# Patient Record
Sex: Female | Born: 1955 | Hispanic: No | Marital: Married | Smoking: Never smoker
Health system: Southern US, Community
[De-identification: ages and names within clinical notes are randomized; demographics above are authoritative.]

## PROBLEM LIST (undated history)

## (undated) DIAGNOSIS — L259 Unspecified contact dermatitis, unspecified cause: Secondary | ICD-10-CM

## (undated) DIAGNOSIS — N951 Menopausal and female climacteric states: Secondary | ICD-10-CM

## (undated) DIAGNOSIS — K219 Gastro-esophageal reflux disease without esophagitis: Secondary | ICD-10-CM

## (undated) DIAGNOSIS — I10 Essential (primary) hypertension: Secondary | ICD-10-CM

## (undated) DIAGNOSIS — R42 Dizziness and giddiness: Secondary | ICD-10-CM

## (undated) HISTORY — DX: Unspecified contact dermatitis, unspecified cause: L25.9

## (undated) HISTORY — PX: NO PAST SURGERIES: SHX2092

## (undated) HISTORY — DX: Menopausal and female climacteric states: N95.1

## (undated) HISTORY — DX: Gastro-esophageal reflux disease without esophagitis: K21.9

## (undated) HISTORY — DX: Essential (primary) hypertension: I10

## (undated) HISTORY — DX: Dizziness and giddiness: R42

---

## 2003-01-02 ENCOUNTER — Encounter: Payer: Self-pay | Admitting: Specialist

## 2003-01-02 ENCOUNTER — Encounter: Admission: RE | Admit: 2003-01-02 | Discharge: 2003-01-02 | Payer: Self-pay | Admitting: Specialist

## 2009-11-08 ENCOUNTER — Ambulatory Visit: Payer: Self-pay | Admitting: Internal Medicine

## 2009-11-08 DIAGNOSIS — R079 Chest pain, unspecified: Secondary | ICD-10-CM | POA: Insufficient documentation

## 2009-11-08 DIAGNOSIS — R9431 Abnormal electrocardiogram [ECG] [EKG]: Secondary | ICD-10-CM | POA: Insufficient documentation

## 2009-11-08 DIAGNOSIS — I10 Essential (primary) hypertension: Secondary | ICD-10-CM | POA: Insufficient documentation

## 2009-11-22 ENCOUNTER — Ambulatory Visit: Payer: Self-pay | Admitting: Internal Medicine

## 2009-11-22 DIAGNOSIS — L259 Unspecified contact dermatitis, unspecified cause: Secondary | ICD-10-CM | POA: Insufficient documentation

## 2010-10-01 NOTE — Assessment & Plan Note (Signed)
Summary: 2 WEEK FOLLOW UP-APPT OKAYED BY JONES NURSE TO SWITCH PER PT-LB   Vital Signs:  Patient profile:   55 year old female Height:      60 inches (152.40 cm) Weight:      105.8 pounds (48.09 kg) O2 Sat:      97 % on Room air Temp:     98.3 degrees F (36.83 degrees C) oral Pulse rate:   53 / minute BP sitting:   142 / 82  (left arm) Cuff size:   regular  Vitals Entered By: Orlan Leavens (November 22, 2009 10:21 AM)  O2 Flow:  Room air CC: Transferring from Dr. Yetta Barre, 2 week f/u on BP Is Patient Diabetic? No Pain Assessment Patient in pain? no        Primary Care Provider:  Newt Lukes MD  CC:  Transferring from Dr. Yetta Barre and 2 week f/u on BP.  History of Present Illness: new to me here for 2 week followup pt speaks no english but here with dtr who translates for her  seen by TJ for chest discomfort related to trauma - CXR reviewed and EKG also started medication (samples) for HTN - chest feels better on vimomo - uses only as needed for disconfort - almost no pain now   Current Medications (verified): 1)  Bystolic 5 Mg Tabs (Nebivolol Hcl) .... One By Mouth Once Daily For High Blood Pressure  Allergies (verified): 1)  ! Prednisone  Past History:  Past Medical History: Hypertension excema  MD rooster: cards - little?  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         +occ dizzy spells while standing, started 1 mo ago - no syncope; not positional  Physical Exam  General:  alert, well-developed, well-nourished, and cooperative to examination.  , dtr at side Eyes:  vision grossly intact; pupils equal, round and reactive to light.  conjunctiva and lids normal.    Lungs:  normal  respiratory effort, no intercostal retractions or use of accessory muscles; normal breath sounds bilaterally - no crackles and no wheezes.    Heart:  normal rate, regular rhythm, no murmur, and no rub. BLE without edema. Abdomen:  soft, non-tender, normal bowel sounds, no distention; no masses and no appreciable hepatomegaly or splenomegaly.   Skin:  +patchy excema across lower back, upper buttocks and front torso lower abd   Impression & Recommendations:  Problem # 1:  HYPERTENSION (ICD-401.9)  change to more affordable option -e-rx done labs recently done for life insurance physical, will bring copy next OV - reck 4 weeks Her updated medication list for this problem includes:    Amlodipine Besylate 5 Mg Tabs (Amlodipine besylate) .Marland Kitchen... 1 by mouth once daily  BP today: 142/82 Prior BP: 118/80 (11/08/2009)  Orders: Prescription Created Electronically [phone])  Problem # 2:  ECZEMA (ICD-692.9)  Her updated medication list for this problem includes:    Triamcinolone Acetonide 0.1 % Oint (Triamcinolone acetonide) .Marland Kitchen... Apply to rash and itch three times a day as needed  Discussed avoidance of triggers and symptomatic treatment.   Orders: Prescription Created Electronically [phone])  Problem # 3:  CHEST PAIN (ICD-786.50) Assessment: Improved s/p trauma - NSAIDs as needed   Complete Medication List: 1)  Amlodipine Besylate 5  Mg Tabs (Amlodipine besylate) .Marland Kitchen.. 1 by mouth once daily 2)  Triamcinolone Acetonide 0.1 % Oint (Triamcinolone acetonide) .... Apply to rash and itch three times a day as needed  Patient Instructions: 1)  it was good to see you today. 2)  chest xray and EKG results reviewed today - will not order any other labs or tests at this time 3)  stop bystolic and start amlodipine for high blood pressure - 4)  also use triamcinalone ointment for rash and itch 5)  your prescriptions have been electronically submitted to your pharmacy. Please take as directed. Contact  our office if you believe you're having problems with the medication(s).  6)  Please schedule a follow-up appointment in 4 weeks, sooner if problems.  Prescriptions: TRIAMCINOLONE ACETONIDE 0.1 % OINT (TRIAMCINOLONE ACETONIDE) apply to rash and itch three times a day as needed  #15g x 1   Entered and Authorized by:   Newt Lukes MD   Signed by:   Newt Lukes MD on 11/22/2009   Method used:   Electronically to        [facility]* (retail)       [address] [place], [place]  [address]       Ph: [phone]       Fax: [phone]   RxID:   [id number] AMLODIPINE BESYLATE 5 MG TABS (AMLODIPINE BESYLATE) 1 by mouth once daily  #30 x 5   Entered and Authorized by:   Newt Lukes MD   Signed by:   Newt Lukes MD on 11/22/2009   Method used:   Electronically to        [facility]* (retail)       [address] [place], [place]  [address]       Ph: [phone]       Fax: [phone]   RxID:   [id number]
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
## 2010-10-22 ENCOUNTER — Telehealth: Payer: Self-pay | Admitting: Internal Medicine

## 2010-10-29 NOTE — Progress Notes (Deleted)
Summary: Call Report  Phone Note Other Incoming   Caller: [facility] Summary of Call: [facility] Triage Call Report Triage Record Num: [id number] Operator: Cynthia Bowlin Patient Name: Danielle Terry Call Date & Time: 10/21/2010 8:36:14PM Patient Phone: [phone] PCP: Patient Gender: Female PCP Fax : Patient DOB: 09/14/1955 Practice Name: [facility] Reason for Call: [name] calling that mom is having nausea and has vomited x 10 + since this AM. No frank chest pain. No diarrhea. Afebrile. Sx started this AM. B/P is 136/78. Last voided at 1700 and has been able to keep some fluid down. Triaged N&V and all ? neg. Home care and call back inst given. Called in per S/O Phenergan 25 mg 1 PO Q 4 hrs prn # 6 and no refills. Called into [facility] on [address] @ [phone] and spoke with Arron. THE PATIENT REFUSED 911 Protocol(s) Used: Nausea or Vomiting Recommended Outcome per Protocol: Provide Home/Self Care Reason for Outcome: New onset of 3-4 episodes vomiting or diarrhea following mild abdominal cramping Care Advice:  ~ Call provider if symptoms do not improve after 24 hours of home care. Call provider immediately if develop severe pain, black, tarry stools, bloody stools, blood-streaked or coffee ground-looking vomitus, or abdomen swollen.  ~ Go to the ED if you have developed signs and symptoms of dehydration such as very dry mouth and tongue; increased pulse rate at rest; no urine output for 8 hours or more; increasing weakness or drowsiness, or lightheadedness when trying to sit upright or standing.  ~ Vomiting and Diarrhea Care: - Do not eat solid foods until vomiting subsides. - Begin taking fluids by sucking on ice chips or popsicles or taking sips of cool, clear fluids (soda, fruit juices that are low acid, sports drinks or nonprescription oral rehydration solution). - Gradually drink larger amounts of these fluids so that you are drinking six to eight 8  oz. (1.2 to 1.6 liters) of fluids a day. - Keep activity to a minimum. - Once vomiting and diarrhea subside, eat smaller, more frequent meals of eas  Follow-up for Phone Call        Pt had NP appt scheduled 10/2009 - cancelled Follow-up by: Dahlia Bonyun-DeBourgh, CMA,  October 22, 2010 8:44 AM    

## 2010-11-05 ENCOUNTER — Other Ambulatory Visit: Payer: Self-pay | Admitting: Internal Medicine

## 2010-11-05 ENCOUNTER — Other Ambulatory Visit: Payer: Self-pay

## 2010-11-05 ENCOUNTER — Encounter: Payer: Self-pay | Admitting: Internal Medicine

## 2010-11-05 ENCOUNTER — Ambulatory Visit (INDEPENDENT_AMBULATORY_CARE_PROVIDER_SITE_OTHER): Payer: Self-pay | Admitting: Internal Medicine

## 2010-11-05 DIAGNOSIS — I10 Essential (primary) hypertension: Secondary | ICD-10-CM

## 2010-11-05 DIAGNOSIS — R1011 Right upper quadrant pain: Secondary | ICD-10-CM

## 2010-11-05 DIAGNOSIS — R110 Nausea: Secondary | ICD-10-CM

## 2010-11-05 DIAGNOSIS — N951 Menopausal and female climacteric states: Secondary | ICD-10-CM | POA: Insufficient documentation

## 2010-11-05 DIAGNOSIS — N390 Urinary tract infection, site not specified: Secondary | ICD-10-CM

## 2010-11-05 LAB — BASIC METABOLIC PANEL
BUN: 16 mg/dL (ref 6–23)
Calcium: 9.5 mg/dL (ref 8.4–10.5)
GFR: 146.77 mL/min (ref >60.00)
Glucose, Bld: 83 mg/dL (ref 70–99)

## 2010-11-05 LAB — CBC WITH DIFFERENTIAL/PLATELET
Basophils Absolute: 0 10*3/uL (ref 0.0–0.1)
Eosinophils Absolute: 0.2 10*3/uL (ref 0.0–0.7)
HCT: 39.8 % (ref 36.0–46.0)
Lymphocytes Relative: 26.7 % (ref 12.0–46.0)
Lymphs Abs: 1.7 10*3/uL (ref 0.7–4.0)
MCHC: 34.6 g/dL (ref 30.0–36.0)
Monocytes Relative: 5.7 % (ref 3.0–12.0)
Platelets: 292 10*3/uL (ref 150.0–400.0)
RDW: 12.9 % (ref 11.5–14.6)

## 2010-11-05 LAB — CONVERTED CEMR LAB
Glucose, Urine, Semiquant: NEGATIVE
Ketones, urine, test strip: NEGATIVE
Urobilinogen, UA: 0.2
pH: 5

## 2010-11-05 LAB — HEPATIC FUNCTION PANEL
AST: 17 U/L (ref 0–37)
Total Bilirubin: 0.4 mg/dL (ref 0.3–1.2)

## 2010-11-05 LAB — TSH: TSH: 0.85 u[IU]/mL (ref 0.35–5.50)

## 2010-11-07 NOTE — Progress Notes (Signed)
Summary: Call Report  Phone Note Other Incoming   Caller: [facility] Summary of Call: [facility] Triage Call Report Triage Record Num: [id number] Operator: Caswell Corwin Patient Name: Danielle Terry Call Date & Time: 10/21/2010 8:36:14PM Patient Phone: [phone] PCP: Patient Gender: Female PCP Fax : Patient DOB: 1956-06-18 Practice Name: [facility] Reason for Call: [name] calling that mom is having nausea and has vomited x 10 + since this AM. No frank chest pain. No diarrhea. Afebrile. Sx started this AM. B/P is 136/78. Last voided at 1700 and has been able to keep some fluid down. Triaged N&V and all ? neg. Home care and call back inst given. Called in per S/O Phenergan 25 mg 1 PO Q 4 hrs prn # 6 and no refills. Called into [facility] on [address] @ [phone] and spoke with Arron. THE PATIENT REFUSED 911 Protocol(s) Used: Nausea or Vomiting Recommended Outcome per Protocol: Provide Home/Self Care Reason for Outcome: New onset of 3-4 episodes vomiting or diarrhea following mild abdominal cramping Care Advice:  ~ Call provider if symptoms do not improve after 24 hours of home care. Call provider immediately if develop severe pain, black, tarry stools, bloody stools, blood-streaked or coffee ground-looking vomitus, or abdomen swollen.  ~ Go to the ED if you have developed signs and symptoms of dehydration such as very dry mouth and tongue; increased pulse rate at rest; no urine output for 8 hours or more; increasing weakness or drowsiness, or lightheadedness when trying to sit upright or standing.  ~ Vomiting and Diarrhea Care: - Do not eat solid foods until vomiting subsides. - Begin taking fluids by sucking on ice chips or popsicles or taking sips of cool, clear fluids (soda, fruit juices that are low acid, sports drinks or nonprescription oral rehydration solution). - Gradually drink larger amounts of these fluids so that you are drinking six to eight 8  oz. (1.2 to 1.6 liters) of fluids a day. - Keep activity to a minimum. - Once vomiting and diarrhea subside, eat smaller, more frequent meals of eas  Follow-up for Phone Call        Pt had NP appt scheduled 10/2009 - cancelled Follow-up by: Margaret Pyle, CMA,  October 22, 2010 8:44 AM

## 2010-11-12 NOTE — Assessment & Plan Note (Signed)
Summary: UTI /NWS   Vital Signs:  Patient profile:   55 year old female O2 Sat:      97 % on Room air Temp:     98.4 degrees F (36.89 degrees C) oral Pulse rate:   62 / minute BP sitting:   110 / 80  (left arm) Cuff size:   regular  Vitals Entered By: Orlan Leavens RMA (November 05, 2010 9:35 AM)  O2 Flow:  Room air CC: ? UTI Is Patient Diabetic? No Pain Assessment Patient in pain? no        Primary Care Provider:  Newt Lukes MD  CC:  ? UTI.  History of Present Illness: here with uti symptoms  onset 5 days ago small vol freq voids no fevevr, no flank pain  also c/o positional vertigo symptoms x 3 weeks head movement causes nausea -  no HA, ear pain or tinnitus  also RUQ pain at times after meals no vomitting or BM change  HTN - reports compliance with ongoing medical treatment and no changes in medication dose or frequency. denies adverse side effects related to current therapy.   Current Medications (verified): 1)  Amlodipine Besylate 5 Mg Tabs (Amlodipine Besylate) .Marland Kitchen.. 1 By Mouth Once Daily 2)  Triamcinolone Acetonide 0.1 % Oint (Triamcinolone Acetonide) .... Apply To Rash and Itch Three Times A Day As Needed  Allergies (verified): 1)  ! 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    Physical Exam  General:  alert, well-developed, well-nourished, and cooperative to examination.  dtr at side Lungs:  normal respiratory effort, no intercostal retractions or use of accessory muscles; normal breath sounds bilaterally - no crackles and no wheezes.    Heart:  normal rate, regular rhythm, no murmur, and no rub. BLE without edema. Neurologic:  No cranial nerve  deficits noted. Station and gait are normal. Plantar reflexes are down-going bilaterally. DTRs are symmetrical throughout. Sensory, motor and coordinative functions appear intact.   Impression & Recommendations:  Problem # 1:  UTI (ICD-599.0)  Her updated medication list for this problem includes:    Ciprofloxacin Hcl 250 Mg Tabs (Ciprofloxacin hcl) .Marland Kitchen... 1 by mouth two times a day x 3 days  Orders: UA Dipstick w/o Micro (manual) (40981) Prescription Created Electronically [phone])  Encouraged to push clear liquids, get enough rest, and take acetaminophen as needed. To be seen in 10 days if no improvement, sooner if worse.  Problem # 2:  NAUSEA (ICD-787.02) suspect related to BPPV but check labs r/o GI or other abn - Her updated medication list for this problem includes:    Meclizine Hcl 12.5 Mg Tabs (Meclizine hcl) .Marland Kitchen... 1 by mouth three times a day as needed for dizzy symptoms - may cause sedation  Orders: TLB-BMP (Basic Metabolic Panel-BMET) (80048-METABOL) TLB-CBC Platelet - w/Differential (85025-CBCD) TLB-Hepatic/Liver Function Pnl (80076-HEPATIC) TLB-TSH (Thyroid Stimulating Hormone) (82956-OZH) Prescription Created Electronically [id number])  Problem # 3:  RUQ PAIN (ICD-789.01)  see nausea above Orders: TLB-BMP (Basic Metabolic Panel-BMET) (80048-METABOL) TLB-CBC Platelet - w/Differential (85025-CBCD) TLB-Hepatic/Liver Function Pnl (80076-HEPATIC) TLB-TSH (Thyroid Stimulating Hormone) (84443-TSH)  Discussed symptom control with the patient.   Problem # 4:  HYPERTENSION (ICD-401.9)  reduce dose amlodipine to avoid overtx exac dizzy and nausea  symptoms  Her updated medication list for this problem includes:    Amlodipine Besylate 2.5 Mg Tabs (Amlodipine besylate) .Marland Kitchen... 1 by mouth once daily for blood pressure  BP today: 110/80 Prior BP: 142/82 (11/22/2009)  Orders: Prescription Created Electronically [id number])  Problem # 5:  MENOPAUSAL SYNDROME (ICD-627.2) start SNRI  for same - f/u 3 mo to review, sooner if prob  Complete Medication List: 1)  Amlodipine Besylate 2.5 Mg Tabs (Amlodipine besylate) .Marland Kitchen.. 1 by mouth once daily for blood pressure 2)  Triamcinolone Acetonide 0.1 % Oint (Triamcinolone acetonide) .... Apply to rash and itch three times a day as needed 3)  Meclizine Hcl 12.5 Mg Tabs (Meclizine hcl) .Marland Kitchen.. 1 by mouth three times a day as needed for dizzy symptoms - may cause sedation 4)  Venlafaxine Hcl 37.5 Mg Xr24h-tab (Venlafaxine hcl) .Marland Kitchen.. 1 by mouth once daily for menopause symptoms 5)  Ciprofloxacin Hcl 250 Mg Tabs (Ciprofloxacin hcl) .Marland Kitchen.. 1 by mouth two times a day x 3 days  Patient Instructions: 1)  it was good to see you today. 2)  test(s) ordered today - your results will be called to you after review in 48-72 hours from the time of test completion 3)  medications as listed below 4)  Please schedule a follow-up appointment in 3 months to recheck blood pressure and review menopause symptoms/medications, call sooner if problems.  Prescriptions: CIPROFLOXACIN HCL 250 MG TABS (CIPROFLOXACIN HCL) 1 by mouth two times a day x 3 days  #6 x 0   Entered and Authorized by:   Newt Lukes MD   Signed by:   Newt Lukes MD on 11/05/2010   Method used:   Electronically to        [facility]* (retail)       [address] [place], [place]  [address]       Ph: [phone]       Fax: [phone]   RxID:   [id number] VENLAFAXINE HCL 37.5 MG XR24H-TAB (VENLAFAXINE HCL) 1 by mouth once daily for menopause symptoms  #30 x 3   Entered and Authorized by:   Newt Lukes MD   Signed by:   Newt Lukes MD on 11/05/2010   Method used:   Electronically to        [facility]* (retail)       [address] [place], [place]  [address]       Ph: [phone]       Fax: [phone]   RxID:   [id number] MECLIZINE HCL 12.5 MG TABS (MECLIZINE HCL) 1 by mouth three  times a day as needed for dizzy symptoms - may cause sedation  #30 x 1   Entered and Authorized by:   Newt Lukes MD   Signed by:   Newt Lukes MD on 11/05/2010   Method used:   Electronically to        [facility]* (retail)       [address] [place], [place]  [address]       Ph: [phone]       Fax: [phone]   RxID:   [id number] AMLODIPINE BESYLATE 2.5 MG TABS (AMLODIPINE BESYLATE) 1 by mouth once daily for blood pressure  #  30 x 6   Entered and Authorized by:   Newt Lukes MD   Signed by:   Newt Lukes MD on 11/05/2010   Method used:   Electronically to        [facility]* (retail)       [address] [place], [place]  [address]       Ph: [phone]       Fax: [phone]   RxID:   [id number]    Orders Added: 1)  UA Dipstick w/o Micro (manual) [81002] 2)  TLB-BMP (Basic Metabolic Panel-BMET) [80048-METABOL] 3)  TLB-CBC Platelet - w/Differential [85025-CBCD] 4)  TLB-Hepatic/Liver Function Pnl [80076-HEPATIC] 5)  TLB-TSH (Thyroid Stimulating Hormone) [84443-TSH] 6)  Est. Patient Level IV [01027] 7)  Prescription Created Electronically [id number]    Laboratory Results   Urine Tests    Routine Urinalysis   Color: lt. yellow Appearance: Clear Glucose: negative   (Normal Range: Negative) Bilirubin: negative   (Normal Range: Negative) Ketone: negative   (Normal Range: Negative) Spec. Gravity: <1.005   (Normal Range: 1.003-1.035) Blood: trace-intact   (Normal Range: Negative) pH: 5.0   (Normal Range: 5.0-8.0) Protein: negative   (Normal Range: Negative) Urobilinogen: 0.2   (Normal Range: 0-1) Nitrite: negative   (Normal Range: Negative) Leukocyte Esterace: small   (Normal Range: Negative)

## 2011-07-10 LAB — BASIC METABOLIC PANEL: Glucose: 72 mg/dL

## 2011-07-10 LAB — HEPATIC FUNCTION PANEL
ALT: 12 U/L (ref 7–35)
AST: 17 U/L (ref 13–35)
Alkaline Phosphatase: 116 U/L (ref 25–125)

## 2011-08-14 ENCOUNTER — Encounter: Payer: Self-pay | Admitting: Internal Medicine

## 2011-08-14 ENCOUNTER — Other Ambulatory Visit: Payer: Self-pay | Admitting: Internal Medicine

## 2011-08-14 ENCOUNTER — Other Ambulatory Visit (INDEPENDENT_AMBULATORY_CARE_PROVIDER_SITE_OTHER): Payer: BC Managed Care – PPO

## 2011-08-14 ENCOUNTER — Ambulatory Visit (INDEPENDENT_AMBULATORY_CARE_PROVIDER_SITE_OTHER): Payer: BC Managed Care – PPO | Admitting: Internal Medicine

## 2011-08-14 VITALS — BP 120/82 | HR 60 | Temp 98.6°F | Wt 100.4 lb

## 2011-08-14 DIAGNOSIS — Z0000 Encounter for general adult medical examination without abnormal findings: Secondary | ICD-10-CM

## 2011-08-14 DIAGNOSIS — Z1211 Encounter for screening for malignant neoplasm of colon: Secondary | ICD-10-CM

## 2011-08-14 DIAGNOSIS — Z124 Encounter for screening for malignant neoplasm of cervix: Secondary | ICD-10-CM

## 2011-08-14 DIAGNOSIS — R110 Nausea: Secondary | ICD-10-CM

## 2011-08-14 DIAGNOSIS — Z1239 Encounter for other screening for malignant neoplasm of breast: Secondary | ICD-10-CM

## 2011-08-14 DIAGNOSIS — K219 Gastro-esophageal reflux disease without esophagitis: Secondary | ICD-10-CM | POA: Insufficient documentation

## 2011-08-14 DIAGNOSIS — I10 Essential (primary) hypertension: Secondary | ICD-10-CM | POA: Insufficient documentation

## 2011-08-14 LAB — CBC WITH DIFFERENTIAL/PLATELET
Basophils Absolute: 0.1 10*3/uL (ref 0.0–0.1)
Eosinophils Absolute: 0.2 10*3/uL (ref 0.0–0.7)
HCT: 41.6 % (ref 36.0–46.0)
Lymphs Abs: 1.9 10*3/uL (ref 0.7–4.0)
MCHC: 34.2 g/dL (ref 30.0–36.0)
MCV: 92.3 fl (ref 78.0–100.0)
Monocytes Absolute: 0.3 10*3/uL (ref 0.1–1.0)
Monocytes Relative: 5 % (ref 3.0–12.0)
Platelets: 310 10*3/uL (ref 150.0–400.0)
RDW: 12.4 % (ref 11.5–14.6)

## 2011-08-14 LAB — URINALYSIS, ROUTINE W REFLEX MICROSCOPIC
Bilirubin Urine: NEGATIVE
Hgb urine dipstick: NEGATIVE
Nitrite: POSITIVE
Urobilinogen, UA: 0.2 (ref 0.0–1.0)

## 2011-08-14 LAB — BASIC METABOLIC PANEL
BUN: 14 mg/dL (ref 6–23)
CO2: 29 mEq/L (ref 19–32)
GFR: 112.57 mL/min (ref >60.00)
Glucose, Bld: 95 mg/dL (ref 70–99)
Potassium: 4.4 mEq/L (ref 3.5–5.1)

## 2011-08-14 LAB — LIPID PANEL
Cholesterol: 206 mg/dL — ABNORMAL HIGH (ref 0–200)
Total CHOL/HDL Ratio: 4
Triglycerides: 104 mg/dL (ref 0.0–149.0)

## 2011-08-14 LAB — HEPATIC FUNCTION PANEL
ALT: 11 U/L (ref 0–35)
Bilirubin, Direct: 0.1 mg/dL (ref 0.0–0.3)
Total Bilirubin: 0.5 mg/dL (ref 0.3–1.2)

## 2011-08-14 LAB — TSH: TSH: 0.6 u[IU]/mL (ref 0.35–5.50)

## 2011-08-14 LAB — LDL CHOLESTEROL, DIRECT: Direct LDL: 133.6 mg/dL

## 2011-08-14 MED ORDER — MECLIZINE HCL 12.5 MG PO TABS: 12.5000 mg | ORAL_TABLET | Freq: Three times a day (TID) | ORAL | Status: AC | PRN

## 2011-08-14 MED ORDER — RANITIDINE HCL 150 MG PO TABS: 150.0000 mg | ORAL_TABLET | Freq: Two times a day (BID) | ORAL | Status: DC

## 2011-08-14 NOTE — Patient Instructions (Signed)
It was good to see you today. Test(s) ordered today. Your results will be called to you after review (48-72hours after test completion). If any changes need to be made, you will be notified at that time. we'll make referral to if her Pap smear, and GI for colonoscopy and for mammogram. Our office will contact you regarding appointment(s) once made. Start Zantac twice daily for stomach symptoms and phlegm, also meclizine as needed for dizziness Your prescription(s) have been submitted to your pharmacy. Please take as directed and contact our office if you believe you are having problem(s) with the medication(s). Other medications reviewed and no changes today Please schedule followup in 6 months for blood pressure check and review, call sooner if problems.

## 2011-08-14 NOTE — Assessment & Plan Note (Signed)
Morning nausea and mild tenderness improves with first meal Await labs as for CPX today - examined history otherwise benign No relief with prior intermittent PPI use but prescribe today twice daily Zantac at this time If symptoms unimproved, will consider imaging or GI evaluation as needed

## 2011-08-14 NOTE — Assessment & Plan Note (Signed)
BP Readings from Last 3 Encounters:  08/14/11 120/82  11/05/10 110/80  11/22/09 142/82   Tolerating amlodipine better than atenolol with fewer spells of fatigue and vertigo (suspected overtreatment on beta-blocker) Neuro exam benign. Recommend use of meclizine as needed in addition to ongoing therapy

## 2011-08-14 NOTE — Progress Notes (Signed)
Subjective:    Patient ID: Danielle Terry, female    DOB: 11-11-1955, 55 y.o.   MRN: [id number]  HPI patient is here today for annual physical. Patient feels well overall.  Would also like to review chronic medical issues:  Hypertension. Changed from atenolol to amlodipine March 2012 because of dizziness and fatigue symptoms - complains of mild transient edema at the end of the day but no other adverse side effects reported. Tolerating medication well, vertigo episodes seem less frequent than while on atenolol tx  Complains of mild epigastric discomfort and nausea. Daily symptom each morning, resolves with breakfast meal. No radiation of pain. No vomiting or bowel changes. No unintended weight changes. Previously prescribed omeprazole but not taking same. Associated with backwash sensation and phlegm clearing in throat but no cough  Past Medical History  Diagnosis Date  . Hypertension   . MENOPAUSAL SYNDROME   . Vertigo   . GERD (gastroesophageal reflux disease)    History reviewed. No pertinent family history. History  Substance Use Topics  . Smoking status: Never Smoker   . Smokeless tobacco: Not on file  . Alcohol Use: No    Review of Systems Constitutional: Negative for fever or weight change.  Respiratory: Negative for cough and shortness of breath.   Cardiovascular: Negative for chest pain or palpitations.  Gastrointestinal: Negative bowel changes. see hpi above Musculoskeletal: Negative for gait problem or joint swelling.  Skin: Negative for rash.  Neurological: Negative for dizziness or headache.  No other specific complaints in a complete review of systems (except as listed in HPI above).     Objective:   Physical Exam BP 120/82  Pulse 60  Temp(Src) 98.6 F (37 C) (Oral)  Wt 100 lb 6.4 oz (45.541 kg)  SpO2 98% Wt Readings from Last 3 Encounters:  08/14/11 100 lb 6.4 oz (45.541 kg)  11/22/09 105 lb 12.8 oz (47.991 kg)  11/08/09 105 lb (47.628 kg)    Constitutional: She is [place] and does not speak [place]. She is thin, but appears well-developed and well-nourished. No distress.  daughter at side, serves as Nurse, learning disability HENT: Head: Normocephalic and atraumatic. Ears: B TMs ok, no erythema or effusion; Nose: Nose normal.  Mouth/Throat: Oropharynx is clear and moist. No oropharyngeal exudate.  Eyes: wears glasses. Conjunctivae and EOM are normal. Pupils are equal, round, and reactive to light. No scleral icterus.  Neck: Normal range of motion. Neck supple. No JVD present. No thyromegaly present.  Cardiovascular: Normal rate, regular rhythm and normal heart sounds.  No murmur heard. No BLE edema. Pulmonary/Chest: Effort normal and breath sounds normal. No respiratory distress. She has no wheezes.  Abdominal: Soft. Bowel sounds are normal. She exhibits no distension. There is minimal epigastric and LUQ tenderness to palpation. No rebound, guarding and no masses GU: Defer to GYN  Musculoskeletal: Normal range of motion, no joint effusions. No gross deformities Neurological: She is alert and oriented to person, place, and time. No cranial nerve deficit. Coordination normal.  Skin: Skin is warm and dry. No rash noted. No erythema.  Psychiatric: She has a normal mood and affect. Her behavior is normal. Judgment and thought content normal.   Lab Results  Component Value Date   WBC 6.4 11/05/2010   HGB 13.8 11/05/2010   HCT 39.8 11/05/2010   PLT 292.0 11/05/2010   GLUCOSE 83 11/05/2010   CHOL 228* 07/10/2011   TRIG 99 07/10/2011   HDL 71* 07/10/2011   ALT 12 07/10/2011   AST 17 07/10/2011  NA 140 11/05/2010   K 4.6 11/05/2010   CL 104 11/05/2010   CREATININE 0.6 07/10/2011   BUN 12 07/10/2011   CO2 30 11/05/2010   TSH 0.85 11/05/2010   HGBA1C 5.7 07/10/2011       Assessment & Plan:  CPX - v70.0 - Patient has been counseled on age-appropriate routine health concerns for screening and prevention. These are reviewed and up-to-date. Immunizations are up-to-date  or declined. Labs ordered and will be reviewed. Referrals for mammography, Pap smear and colonoscopy arranged today  Also see problem list. Medications and labs reviewed today.

## 2011-08-18 ENCOUNTER — Other Ambulatory Visit: Payer: Self-pay | Admitting: Internal Medicine

## 2011-08-18 MED ORDER — CIPROFLOXACIN HCL 250 MG PO TABS: 250.0000 mg | ORAL_TABLET | Freq: Two times a day (BID) | ORAL | Status: AC

## 2011-10-03 ENCOUNTER — Other Ambulatory Visit (HOSPITAL_COMMUNITY): Payer: Self-pay | Admitting: Obstetrics & Gynecology

## 2011-10-03 DIAGNOSIS — Z780 Asymptomatic menopausal state: Secondary | ICD-10-CM

## 2011-10-03 DIAGNOSIS — Z1231 Encounter for screening mammogram for malignant neoplasm of breast: Secondary | ICD-10-CM

## 2011-11-05 ENCOUNTER — Ambulatory Visit (HOSPITAL_COMMUNITY)
Admission: RE | Admit: 2011-11-05 | Discharge: 2011-11-05 | Disposition: A | Discharge status: Home / Self Care | Payer: BC Managed Care – PPO | Source: Ambulatory Visit | Attending: Obstetrics & Gynecology | Admitting: Obstetrics & Gynecology

## 2011-11-05 DIAGNOSIS — Z13820 Encounter for screening for osteoporosis: Secondary | ICD-10-CM | POA: Insufficient documentation

## 2011-11-05 DIAGNOSIS — Z1231 Encounter for screening mammogram for malignant neoplasm of breast: Secondary | ICD-10-CM

## 2011-11-05 DIAGNOSIS — Z780 Asymptomatic menopausal state: Secondary | ICD-10-CM

## 2012-01-02 ENCOUNTER — Encounter: Payer: Self-pay | Admitting: Internal Medicine

## 2012-01-02 DIAGNOSIS — L259 Unspecified contact dermatitis, unspecified cause: Secondary | ICD-10-CM

## 2012-02-10 ENCOUNTER — Ambulatory Visit: Payer: BC Managed Care – PPO | Admitting: Internal Medicine

## 2012-02-10 DIAGNOSIS — Z0289 Encounter for other administrative examinations: Secondary | ICD-10-CM

## 2012-06-07 ENCOUNTER — Ambulatory Visit: Payer: BC Managed Care – PPO | Admitting: Internal Medicine

## 2012-08-13 ENCOUNTER — Ambulatory Visit: Payer: Self-pay | Admitting: Internal Medicine

## 2012-08-22 IMAGING — MG MM DIGITAL SCREENING BILAT
5 series · 5 of 5 positions shown · non-contrast
Comparison: none

DG SCREEN MAMMOGRAM BILATERAL
Bilateral CC and MLO view(s) were taken.

DIGITAL SCREENING MAMMOGRAM WITH CAD:
The breast tissue is heterogeneously dense.  There is no dominant mass, architectural distortion or
calcification to suggest malignancy.
Images were processed with CAD.

[R CC (1 of 2)]
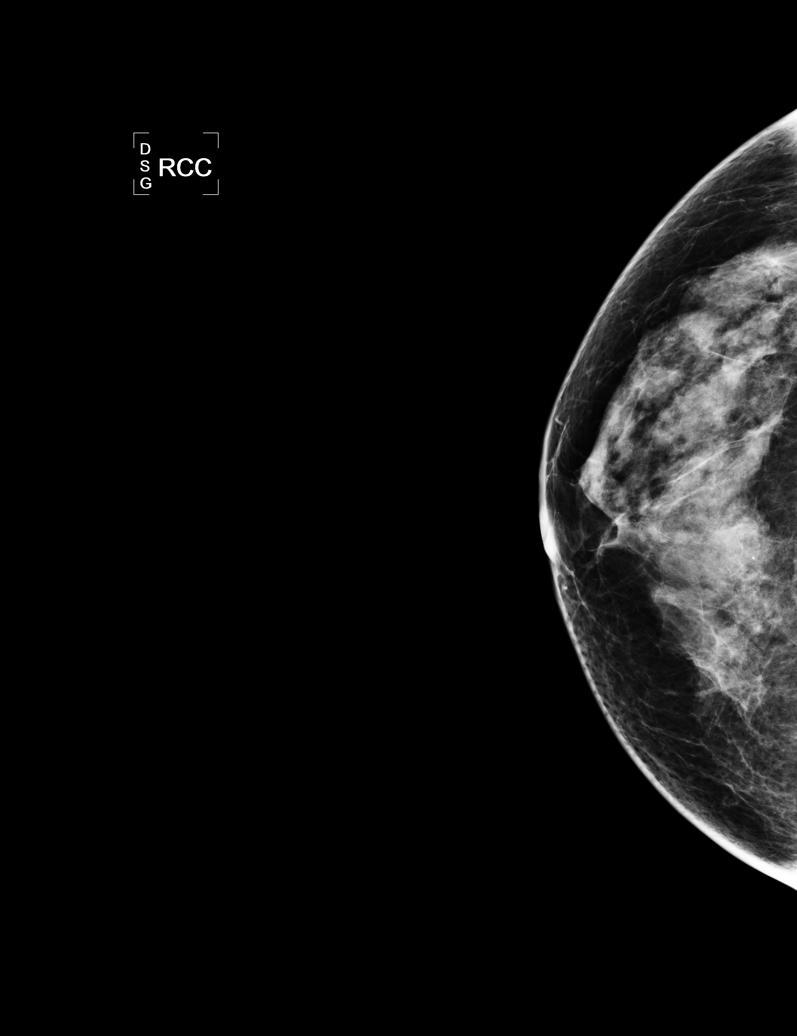

[R MLO]
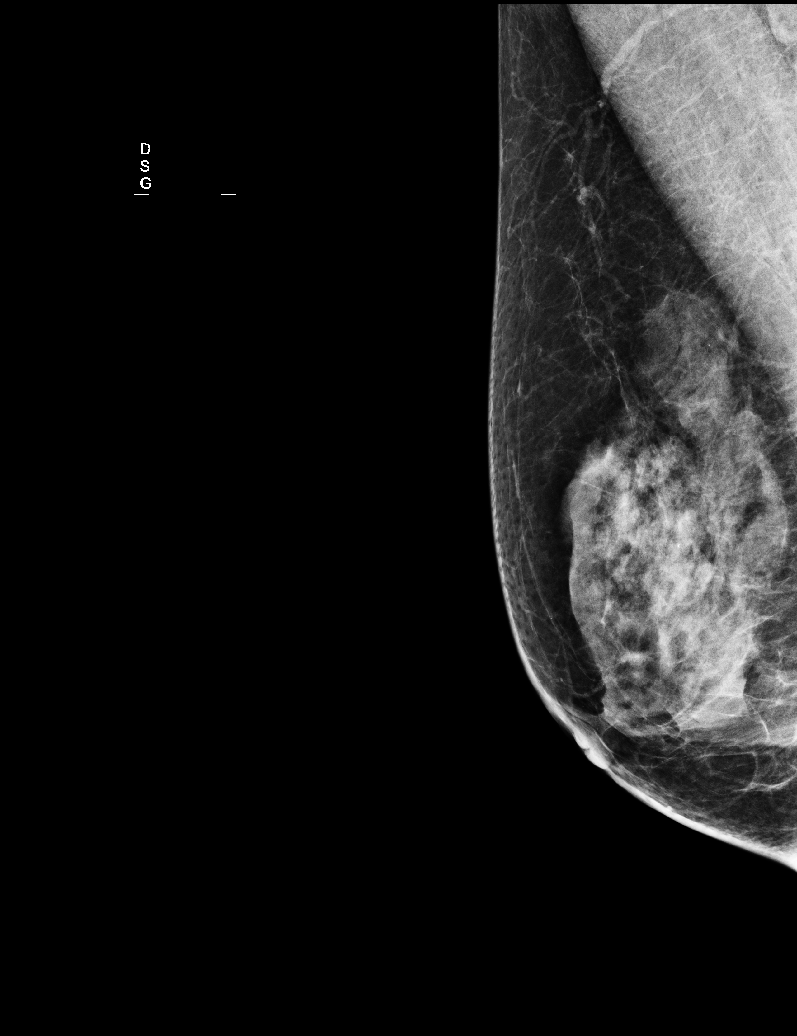

[L CC]
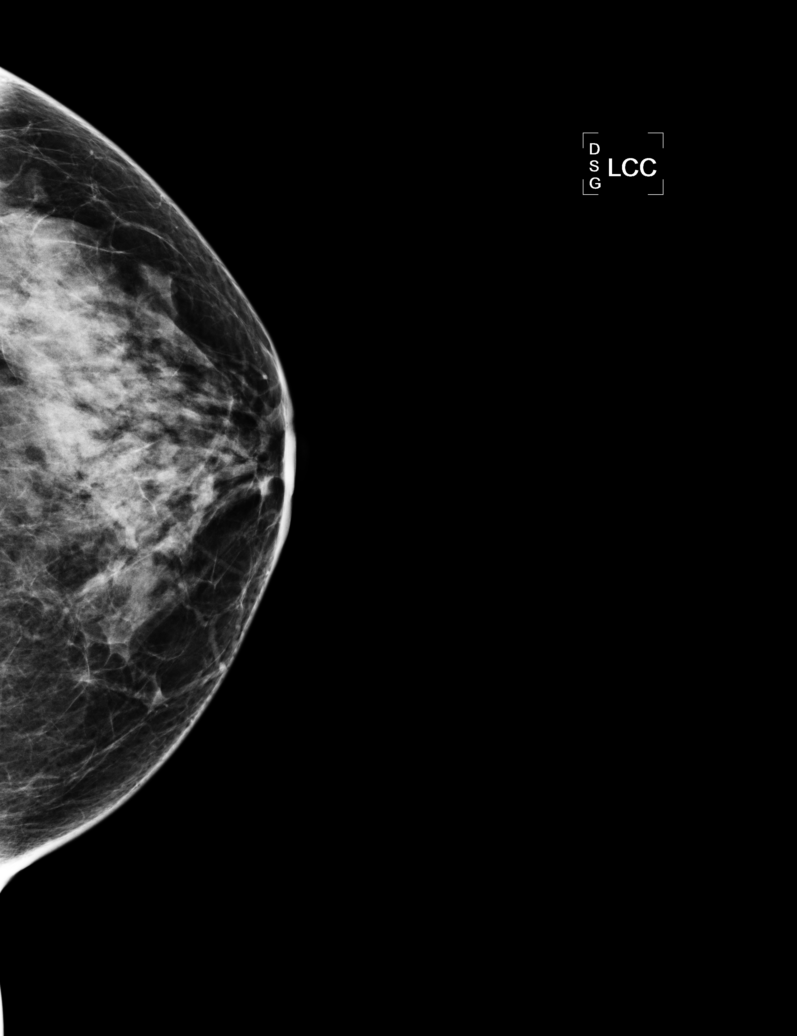

[L MLO]
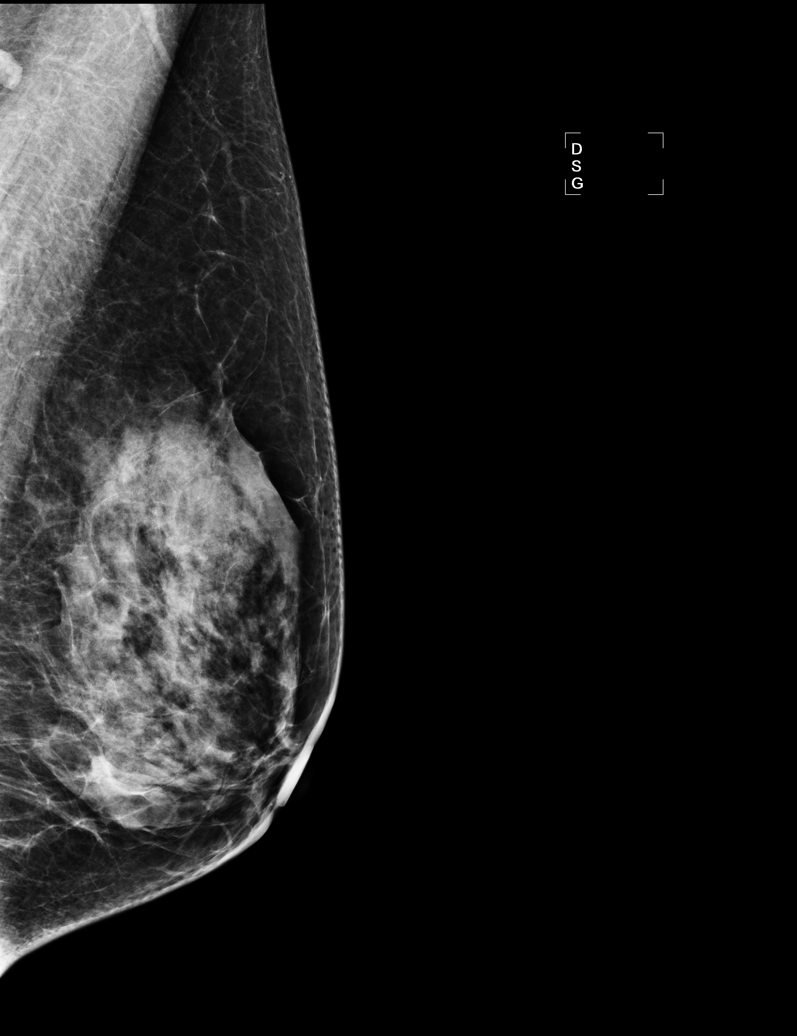

[R CC (2 of 2)]
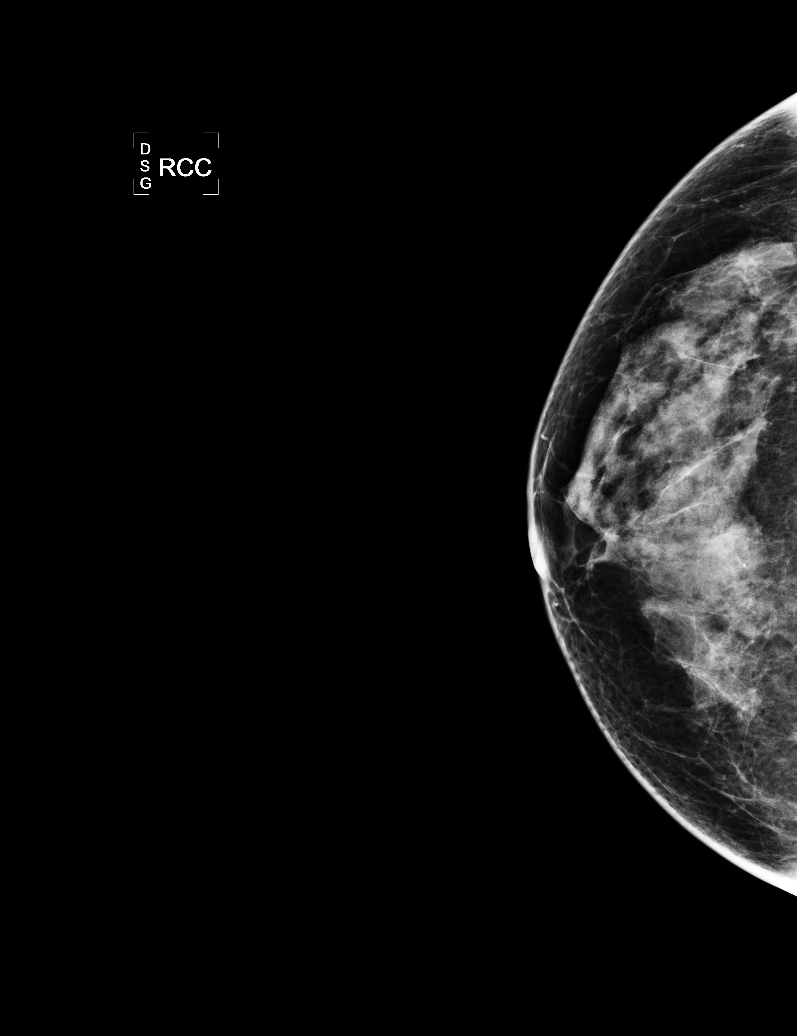

[5 of 5 positions shown; findings below may reference images not displayed]

IMPRESSION: No mammographic evidence of malignancy.  Suggest yearly screening mammography.

A result letter of this screening mammogram will be mailed directly to the patient.

ASSESSMENT: Negative - BI-RADS 1

Screening mammogram in 1 year.
,

## 2012-09-03 ENCOUNTER — Ambulatory Visit: Payer: BC Managed Care – PPO | Admitting: Internal Medicine

## 2012-09-07 ENCOUNTER — Ambulatory Visit: Payer: BC Managed Care – PPO | Admitting: Internal Medicine

## 2012-09-08 ENCOUNTER — Ambulatory Visit (INDEPENDENT_AMBULATORY_CARE_PROVIDER_SITE_OTHER): Payer: BC Managed Care – PPO | Admitting: Internal Medicine

## 2012-09-08 ENCOUNTER — Ambulatory Visit (INDEPENDENT_AMBULATORY_CARE_PROVIDER_SITE_OTHER)
Admission: RE | Admit: 2012-09-08 | Discharge: 2012-09-08 | Disposition: A | Discharge status: Home / Self Care | Payer: BC Managed Care – PPO | Source: Ambulatory Visit | Attending: Internal Medicine | Admitting: Internal Medicine

## 2012-09-08 ENCOUNTER — Encounter: Payer: Self-pay | Admitting: Internal Medicine

## 2012-09-08 VITALS — BP 120/82 | HR 76 | Temp 99.2°F | Resp 18

## 2012-09-08 DIAGNOSIS — M5416 Radiculopathy, lumbar region: Secondary | ICD-10-CM

## 2012-09-08 DIAGNOSIS — IMO0002 Reserved for concepts with insufficient information to code with codable children: Secondary | ICD-10-CM

## 2012-09-08 DIAGNOSIS — M549 Dorsalgia, unspecified: Secondary | ICD-10-CM

## 2012-09-08 MED ORDER — METHOCARBAMOL 500 MG PO TABS: 500.0000 mg | ORAL_TABLET | Freq: Three times a day (TID) | ORAL | Status: DC | PRN

## 2012-09-08 MED ORDER — DEXAMETHASONE 1.5 MG PO KIT: PACK | ORAL | Status: DC

## 2012-09-08 NOTE — Progress Notes (Signed)
Subjective:    Patient ID: Danielle Terry, female    DOB: Dec 16, 1955, 57 y.o.   MRN: [id number]  Back Pain This is a new problem. The current episode started more than 1 month ago. The problem occurs intermittently. The problem is unchanged. The pain is present in the lumbar spine and gluteal. The quality of the pain is described as cramping and shooting. The pain radiates to the left thigh. The pain is moderate. The pain is worse during the day. The symptoms are aggravated by sitting, standing and coughing. Stiffness is present all day. Associated symptoms include leg pain. Pertinent negatives include no abdominal pain, bladder incontinence, bowel incontinence, chest pain, dysuria, fever, headaches, numbness, paresis, paresthesias, pelvic pain, perianal numbness, tingling, weakness or weight loss. Risk factors: fall 05/2012. She has tried NSAIDs and analgesics for the symptoms. The treatment provided no relief.   Past Medical History  Diagnosis Date  . Hypertension   . MENOPAUSAL SYNDROME   . Vertigo   . GERD (gastroesophageal reflux disease)   . ECZEMA     Review of Systems  Constitutional: Negative for fever and weight loss.  Cardiovascular: Negative for chest pain.  Gastrointestinal: Negative for abdominal pain and bowel incontinence.  Genitourinary: Negative for bladder incontinence, dysuria and pelvic pain.  Musculoskeletal: Positive for back pain.  Neurological: Negative for tingling, weakness, numbness, headaches and paresthesias.       Objective:   Physical Exam BP 120/82  Pulse 76  Temp 99.2 F (37.3 C)  Resp 18 Wt Readings from Last 3 Encounters:  08/14/11 100 lb 6.4 oz (45.541 kg)  11/22/09 105 lb 12.8 oz (47.991 kg)  11/08/09 105 lb (47.628 kg)   Constitutional: She appears well-developed and well-nourished. No distress. nontoxic Cardiovascular: Normal rate, regular rhythm and normal heart sounds.  No murmur heard. No BLE edema. Pulmonary/Chest: Effort normal and  breath sounds normal. No respiratory distress. She has no wheezes.  Musculoskeletal: Back: full range of motion of thoracic and lumbar spine. tender to palpation over upper lumbar and L paraspinal region. Positive ipsilateral and contralater straight leg raise with pain in LLE. DTR's are symmetrically intact. Sensation intact in all dermatomes of the lower extremities. Full strength to manual muscle testing. patient is able to heel toe walk without difficulty and ambulates with antalgic gait. Neurological: She is alert and oriented to person, place, and time. No cranial nerve deficit. Coordination normal.  Skin: Skin is warm and dry. No rash noted. No erythema.  Psychiatric: She has a normal mood and affect. Her behavior is normal. Judgment and thought content normal.   Lab Results  Component Value Date   WBC 6.8 08/14/2011   HGB 14.2 08/14/2011   HCT 41.6 08/14/2011   PLT 310.0 08/14/2011   GLUCOSE 95 08/14/2011   CHOL 206* 08/14/2011   TRIG 104.0 08/14/2011   HDL 58.20 08/14/2011   LDLDIRECT 133.6 08/14/2011   ALT 11 08/14/2011   AST 17 08/14/2011   NA 140 08/14/2011   K 4.4 08/14/2011   CL 105 08/14/2011   CREATININE 0.6 08/14/2011   BUN 14 08/14/2011   CO2 29 08/14/2011   TSH 0.60 08/14/2011   HGBA1C 5.7 07/10/2011       Assessment & Plan:  LLE pain and sciatica since fall 05/2012 Lumbar back pain since fall 05/2012  Check plan film rule out compression fx MRI L spine rule out HNP or other nerve impingement tx with steroids - will use dexa in place of pred due to ?  pred allergy (caused fever) Also muscle relaxer

## 2012-09-08 NOTE — Patient Instructions (Signed)
It was good to see you today. We have reviewed your prior records including labs and tests today Dexamethasone taper x 6days for inflammation pain and robaxin as needed for muscle spasm pain - Your prescription(s) have been submitted to your pharmacy. Please take as directed and contact our office if you believe you are having problem(s) with the medication(s). Test(s) ordered today - xray and MRI refer. Your results will be released to MyChart (or called to you) after review, usually within 72hours after test completion. If any changes need to be made, you will be notified at that same time. Back Exercises Back exercises help treat and prevent back injuries. The goal is to increase your strength in your belly (abdominal) and back muscles. These exercises can also help with flexibility. Start these exercises when told by your doctor. HOME CARE Back exercises include: Pelvic Tilt.  Lie on your back with your knees bent. Tilt your pelvis until the lower part of your back is against the floor. Hold this position 5 to 10 sec. Repeat this exercise 5 to 10 times.  Knee to Chest.  Pull 1 knee up against your chest and hold for 20 to 30 seconds. Repeat this with the other knee. This may be done with the other leg straight or bent, whichever feels better. Then, pull both knees up against your chest.  Sit-Ups or Curl-Ups.  Bend your knees 90 degrees. Start with tilting your pelvis, and do a partial, slow sit-up. Only lift your upper half 30 to 45 degrees off the floor. Take at least 2 to 3 seonds for each sit-up. Do not do sit-ups with your knees out straight. If partial sit-ups are difficult, simply do the above but with only tightening your belly (abdominal) muscles and holding it as told.  Hip-Lift.  Lie on your back with your knees flexed 90 degrees. Push down with your feet and shoulders as you raise your hips 2 inches off the floor. Hold for 10 seconds, repeat 5 to 10 times.  Back Arches.  Lie on  your stomach. Prop yourself up on bent elbows. Slowly press on your hands, causing an arch in your low back. Repeat 3 to 5 times.  Shoulder-Lifts.  Lie face down with arms beside your body. Keep hips and belly pressed to floor as you slowly lift your head and shoulders off the floor.  Do not overdo your exercises. Be careful in the beginning. Exercises may cause you some mild back discomfort. If the pain lasts for more than 15 minutes, stop the exercises until you see your doctor. Improvement with exercise for back problems is slow.   Document Released: 09/20/2010 Document Revised: 11/10/2011 Document Reviewed: 06/19/2011 [facility] Patient Information 2013 [place], [place].

## 2012-09-17 ENCOUNTER — Telehealth: Payer: Self-pay | Admitting: Internal Medicine

## 2012-09-17 NOTE — Telephone Encounter (Signed)
Patient is complaining that the medication she was given to help with her back is not helping and she wants to see if there is something else to try, Call [facility] @ [phone]

## 2012-09-17 NOTE — Telephone Encounter (Signed)
Please read note below

## 2012-09-18 MED ORDER — TRAMADOL HCL 50 MG PO TABS: 50.0000 mg | ORAL_TABLET | Freq: Four times a day (QID) | ORAL | Status: DC | PRN

## 2012-09-18 NOTE — Telephone Encounter (Signed)
1) tramadol 50mg  q6h prn - erx done 2) please coordinate with [facility] re: scheduling MRI lumbar spine as it appears [facility] has had difficulty contacting pt (per notes in imaging order referral) - we need to get MRI done!

## 2012-09-20 MED ORDER — HYDROCODONE-ACETAMINOPHEN 5-325 MG PO TABS: 1.0000 | ORAL_TABLET | Freq: Four times a day (QID) | ORAL | Status: DC | PRN

## 2012-09-20 NOTE — Telephone Encounter (Signed)
Can try Norco - please call or fax in rx

## 2012-09-20 NOTE — Telephone Encounter (Signed)
Rx for Norco called into [facility], pt's daughter informed.

## 2012-09-20 NOTE — Telephone Encounter (Signed)
Per daughter, pt has trouble with Tramadol, it causes nausea. Also gave daughter's # to [facility] to set up appointment for MRI.

## 2012-09-22 ENCOUNTER — Ambulatory Visit
Admission: RE | Admit: 2012-09-22 | Discharge: 2012-09-22 | Disposition: A | Discharge status: Home / Self Care | Payer: BC Managed Care – PPO | Source: Ambulatory Visit | Attending: Internal Medicine | Admitting: Internal Medicine

## 2012-09-22 ENCOUNTER — Telehealth: Payer: Self-pay | Admitting: Internal Medicine

## 2012-09-22 DIAGNOSIS — M5416 Radiculopathy, lumbar region: Secondary | ICD-10-CM | POA: Insufficient documentation

## 2012-09-22 DIAGNOSIS — S32030A Wedge compression fracture of third lumbar vertebra, initial encounter for closed fracture: Secondary | ICD-10-CM | POA: Insufficient documentation

## 2012-09-22 DIAGNOSIS — M549 Dorsalgia, unspecified: Secondary | ICD-10-CM

## 2012-09-22 NOTE — Telephone Encounter (Signed)
2 ND DAUGHTER OF PATIENT, RACHAEL, CALLED TO ASK WHY SOMEONE CALL HER MOM HOUSE. STATES HER SISTER ,jIN , IS AT SCHOOL. INFORMATION FROM DR. LESCHBER GIVEN TO RACHAEL AND UNDERSTANDS THE NEED TO GO TO SPECIALIST AND THAT [facility] WILL CALL WITH DATE AND TIME OF APPT. RACHAEL # [phone].

## 2012-09-22 NOTE — Telephone Encounter (Signed)
I have tried all 4 phone numbers (3 listed for pt and 1 listed for dtr Shon Hough) - no answer at any, [facility] with Jin's number -but  please continue to try to relay this info:   Please call patient/family: MRI results confirm her L3 compression fracture as well as nerve impingement on right sided L4.  These 2 abnormalities on MRI are the cause of patient's pain symptoms.  to treat her pain, I will refer to spine specialist who can address both of these issues. [facility] will call regarding this appointment  Until then, continue pain medications as ongoing. Okay to refill if needed

## 2012-10-16 ENCOUNTER — Other Ambulatory Visit: Payer: Self-pay

## 2013-02-24 ENCOUNTER — Encounter: Payer: Self-pay | Admitting: Internal Medicine

## 2013-02-24 ENCOUNTER — Ambulatory Visit (INDEPENDENT_AMBULATORY_CARE_PROVIDER_SITE_OTHER): Payer: BC Managed Care – PPO | Admitting: Internal Medicine

## 2013-02-24 VITALS — BP 130/82 | HR 71 | Temp 98.0°F | Wt 97.0 lb

## 2013-02-24 DIAGNOSIS — I10 Essential (primary) hypertension: Secondary | ICD-10-CM

## 2013-02-24 DIAGNOSIS — L259 Unspecified contact dermatitis, unspecified cause: Secondary | ICD-10-CM

## 2013-02-24 MED ORDER — TRIAMCINOLONE ACETONIDE 0.1 % EX LOTN: TOPICAL_LOTION | Freq: Two times a day (BID) | CUTANEOUS | Status: DC

## 2013-02-24 MED ORDER — PREDNISONE (PAK) 10 MG PO TABS: 10.0000 mg | ORAL_TABLET | ORAL | Status: DC

## 2013-02-24 MED ORDER — AMLODIPINE BESYLATE 2.5 MG PO TABS: 2.5000 mg | ORAL_TABLET | Freq: Every day | ORAL | Status: DC

## 2013-02-24 NOTE — Assessment & Plan Note (Signed)
BP Readings from Last 3 Encounters:  02/24/13 130/82  09/08/12 120/82  08/14/11 120/82   Tolerating amlodipine better than atenolol with fewer spells of fatigue and vertigo (suspected overtreatment on beta-blocker) Neuro exam benign. Recommend use of meclizine as needed in addition to ongoing therapy

## 2013-02-24 NOTE — Patient Instructions (Signed)
It was good to see you today. We have reviewed your prior records including labs and tests today Use prednisone tablet taper over the next 6 days to help with allergic reaction Use triamcinolone cream for itching as needed refill blood pressure medication as requested Followup in 6-12 months for blood pressure recheck, call sooner if he

## 2013-02-24 NOTE — Progress Notes (Signed)
Subjective:    Patient ID: Danielle Terry, female    DOB: 1956/04/06, 57 y.o.   MRN: [id number]  Rash This is a recurrent problem. The current episode started in the past 7 days. The problem has been gradually worsening since onset. The affected locations include the groin, genitalia, right buttock, left buttock, left upper leg, right upper leg, left lower leg and right lower leg. The rash is characterized by redness and itchiness. She was exposed to nothing. Pertinent negatives include no anorexia, congestion, cough, diarrhea, facial edema, fatigue, fever, joint pain, nail changes, rhinorrhea, shortness of breath, sore throat or vomiting. Past treatments include antihistamine and cold compress. The treatment provided mild relief. Her past medical history is significant for allergies and eczema.   Also here for follow up - reviewed chronic medical issues  Hypertension. Changed from atenolol to amlodipine March 2012 because of dizziness and fatigue symptoms - complains of mild transient edema at the end of the day but no other adverse side effects reported. Tolerating medication well, vertigo episodes are less frequent than while on atenolol tx   Past Medical History  Diagnosis Date  . Hypertension   . MENOPAUSAL SYNDROME   . Vertigo   . GERD (gastroesophageal reflux disease)   . ECZEMA    Review of Systems  Constitutional: Negative for fever and fatigue.  HENT: Negative for congestion, sore throat and rhinorrhea.   Respiratory: Negative for cough and shortness of breath.   Gastrointestinal: Negative for vomiting, diarrhea and anorexia.  Musculoskeletal: Negative for joint pain.  Skin: Positive for rash. Negative for nail changes.      Objective:   Physical Exam  BP 130/82  Pulse 71  Temp(Src) 98 F (36.7 C) (Oral)  Wt 97 lb (43.999 kg)  BMI 18.94 kg/m2  SpO2 96% Wt Readings from Last 3 Encounters:  02/24/13 97 lb (43.999 kg)  08/14/11 100 lb 6.4 oz (45.541 kg)  11/22/09 105 lb  12.8 oz (47.991 kg)   Constitutional: She is [place] and does not speak [place]. She is thin, but appears well-developed and well-nourished. No distress.  daughter at side, serves as Nurse, learning disability Neck: Normal range of motion. Neck supple. No JVD present. No thyromegaly present.  Cardiovascular: Normal rate, regular rhythm and normal heart sounds.  No murmur heard. No BLE edema. Pulmonary/Chest: Effort normal and breath sounds normal. No respiratory distress. She has no wheezes.  Skin: eczema changes - L>R buttock, posterior knees B and B groin; all with evidence for excoriation - remaining skin is warm and dry. No rash noted above waist on back, anterior chest, breast, head/neck or arms/hands. Psychiatric: She has a normal mood and affect. Her behavior is normal. Judgment and thought content normal.   Lab Results  Component Value Date   WBC 6.8 08/14/2011   HGB 14.2 08/14/2011   HCT 41.6 08/14/2011   PLT 310.0 08/14/2011   GLUCOSE 95 08/14/2011   CHOL 206* 08/14/2011   TRIG 104.0 08/14/2011   HDL 58.20 08/14/2011   LDLDIRECT 133.6 08/14/2011   ALT 11 08/14/2011   AST 17 08/14/2011   NA 140 08/14/2011   K 4.4 08/14/2011   CL 105 08/14/2011   CREATININE 0.6 08/14/2011   BUN 14 08/14/2011   CO2 29 08/14/2011   TSH 0.60 08/14/2011   HGBA1C 5.7 07/10/2011       Assessment & Plan:   Eczema - hx same - tx with pred (verified no true allergy to same) and topical steroid as needed - erx  done - ok to continue OTC benadryl prn  also see problem list. Medications and labs reviewed today.

## 2013-03-01 LAB — HM PAP SMEAR

## 2013-07-07 ENCOUNTER — Other Ambulatory Visit: Payer: Self-pay

## 2013-07-26 ENCOUNTER — Encounter: Payer: Self-pay | Admitting: Internal Medicine

## 2013-07-26 ENCOUNTER — Other Ambulatory Visit (INDEPENDENT_AMBULATORY_CARE_PROVIDER_SITE_OTHER): Payer: BC Managed Care – PPO

## 2013-07-26 ENCOUNTER — Ambulatory Visit (INDEPENDENT_AMBULATORY_CARE_PROVIDER_SITE_OTHER): Payer: BC Managed Care – PPO | Admitting: Internal Medicine

## 2013-07-26 VITALS — BP 134/82 | HR 66 | Temp 98.1°F | Wt 93.1 lb

## 2013-07-26 DIAGNOSIS — I10 Essential (primary) hypertension: Secondary | ICD-10-CM

## 2013-07-26 DIAGNOSIS — Z0000 Encounter for general adult medical examination without abnormal findings: Secondary | ICD-10-CM

## 2013-07-26 DIAGNOSIS — K219 Gastro-esophageal reflux disease without esophagitis: Secondary | ICD-10-CM

## 2013-07-26 DIAGNOSIS — Z1239 Encounter for other screening for malignant neoplasm of breast: Secondary | ICD-10-CM

## 2013-07-26 DIAGNOSIS — R5381 Other malaise: Secondary | ICD-10-CM

## 2013-07-26 DIAGNOSIS — Z1211 Encounter for screening for malignant neoplasm of colon: Secondary | ICD-10-CM

## 2013-07-26 LAB — HEPATIC FUNCTION PANEL
Albumin: 3.9 g/dL (ref 3.5–5.2)
Alkaline Phosphatase: 82 U/L (ref 39–117)
Total Protein: 7.1 g/dL (ref 6.0–8.3)

## 2013-07-26 LAB — LIPID PANEL
Cholesterol: 203 mg/dL — ABNORMAL HIGH (ref 0–200)
HDL: 56.6 mg/dL (ref >39.00)
Triglycerides: 79 mg/dL (ref 0.0–149.0)
VLDL: 15.8 mg/dL (ref 0.0–40.0)

## 2013-07-26 LAB — BASIC METABOLIC PANEL
BUN: 13 mg/dL (ref 6–23)
Creatinine, Ser: 0.5 mg/dL (ref 0.4–1.2)
GFR: 132.24 mL/min (ref >60.00)
Glucose, Bld: 86 mg/dL (ref 70–99)
Potassium: 4.3 mEq/L (ref 3.5–5.1)

## 2013-07-26 LAB — CBC WITH DIFFERENTIAL/PLATELET
Basophils Absolute: 0.1 10*3/uL (ref 0.0–0.1)
Eosinophils Relative: 1.9 % (ref 0.0–5.0)
Hemoglobin: 14.2 g/dL (ref 12.0–15.0)
Lymphocytes Relative: 31.2 % (ref 12.0–46.0)
Monocytes Relative: 5.7 % (ref 3.0–12.0)
Neutro Abs: 4.6 10*3/uL (ref 1.4–7.7)
Platelets: 327 10*3/uL (ref 150.0–400.0)
RDW: 12.8 % (ref 11.5–14.6)
WBC: 7.5 10*3/uL (ref 4.5–10.5)

## 2013-07-26 LAB — LDL CHOLESTEROL, DIRECT: Direct LDL: 139.7 mg/dL

## 2013-07-26 MED ORDER — AMLODIPINE BESYLATE 2.5 MG PO TABS: 2.5000 mg | ORAL_TABLET | Freq: Every day | ORAL | Status: DC

## 2013-07-26 MED ORDER — OMEPRAZOLE 40 MG PO CPDR: 40.0000 mg | DELAYED_RELEASE_CAPSULE | Freq: Every day | ORAL | Status: DC

## 2013-07-26 NOTE — Assessment & Plan Note (Signed)
Hx same, worse with "stress" (recent death of g-dad reviewed)  labs as for CPX today - examined history otherwise benign Start PPI qd Also, given hx same, will refer to GI for evaluation as needed (requests EGD - may not be unreasonable given unintentional weight loss)

## 2013-07-26 NOTE — Assessment & Plan Note (Signed)
BP Readings from Last 3 Encounters:  07/26/13 134/82  02/24/13 130/82  09/08/12 120/82   Tolerating amlodipine better than prev atenolol with fewer spells of fatigue and vertigo (suspected overtreatment with prior beta-blocker) Neuro exam benign.  The current medical regimen is effective;  continue present plan and medications.

## 2013-07-26 NOTE — Progress Notes (Signed)
Subjective:    Patient ID: Danielle Terry, female    DOB: 07/09/1956, 57 y.o.   MRN: [id number]  HPI  Here for medicare wellness  Diet: heart healthy or DM if diabetic Physical activity: sedentary Depression/mood screen: negative Hearing: intact to whispered voice Visual acuity: grossly normal, performs annual eye exam  ADLs: capable Fall risk: none Home safety: good Cognitive evaluation: intact to orientation, naming, recall and repetition EOL planning: adv directives, full code/ I agree  I have personally reviewed and have noted 1. The patient's medical and social history 2. Their use of alcohol, tobacco or illicit drugs 3. Their current medications and supplements 4. The patient's functional ability including ADL's, fall risks, home safety risks and hearing or visual impairment. 5. Diet and physical activities 6. Evidence for depression or mood disorders   Also here for follow up - reviewed chronic medical issues  Hypertension. Changed from atenolol to amlodipine March 2012 because of dizziness and fatigue symptoms - complains of mild transient edema at the end of the day but no other adverse side effects reported. Tolerating medication well, vertigo episodes are less frequent than while on atenolol tx  Reports increase in reflex, not controled with OTC antacids and PPI - requests EGD (and colo screen)  Past Medical History  Diagnosis Date  . Hypertension   . MENOPAUSAL SYNDROME   . Vertigo   . GERD (gastroesophageal reflux disease)   . ECZEMA    Review of Systems  Constitutional: Positive for fatigue and unexpected weight change (hard to gain weight). Negative for fever, activity change and appetite change.  Respiratory: Negative for cough, shortness of breath and wheezing.   Cardiovascular: Negative for chest pain, palpitations and leg swelling.  Gastrointestinal: Negative for nausea, vomiting, abdominal pain, diarrhea and blood in stool.       Increase reflux   Musculoskeletal: Negative for back pain and joint swelling.  Skin: Negative for rash.  Neurological: Negative for dizziness, weakness, light-headedness and headaches.  Psychiatric/Behavioral: Negative for dysphoric mood. The patient is not nervous/anxious.   All other systems reviewed and are negative.      Objective:   Physical Exam BP 134/82  Pulse 66  Temp(Src) 98.1 F (36.7 C) (Oral)  Wt 93 lb 1.9 oz (42.239 kg)  SpO2 97% Wt Readings from Last 3 Encounters:  07/26/13 93 lb 1.9 oz (42.239 kg)  02/24/13 97 lb (43.999 kg)  08/14/11 100 lb 6.4 oz (45.541 kg)   Constitutional: She is [place] and does not speak [place]. She is thin, but appears well-developed and well-nourished. No distress.  daughter at side, serves as Nurse, learning disability Neck: Normal range of motion. Neck supple. No JVD present. No thyromegaly present.  Cardiovascular: Normal rate, regular rhythm and normal heart sounds.  No murmur heard. No BLE edema. Pulmonary/Chest: Effort normal and breath sounds normal. No respiratory distress. She has no wheezes.  Abdomen: SNTND+BS, no mass Skin: no rash or lesions Psychiatric: She has a normal mood and affect. Her behavior is normal. Judgment and thought content normal.   Lab Results  Component Value Date   WBC 6.8 08/14/2011   HGB 14.2 08/14/2011   HCT 41.6 08/14/2011   PLT 310.0 08/14/2011   GLUCOSE 95 08/14/2011   CHOL 206* 08/14/2011   TRIG 104.0 08/14/2011   HDL 58.20 08/14/2011   LDLDIRECT 133.6 08/14/2011   ALT 11 08/14/2011   AST 17 08/14/2011   NA 140 08/14/2011   K 4.4 08/14/2011   CL 105 08/14/2011   CREATININE  0.6 08/14/2011   BUN 14 08/14/2011   CO2 29 08/14/2011   TSH 0.60 08/14/2011   HGBA1C 5.7 07/10/2011       Assessment & Plan:   AWV/v70.0 - Today patient counseled on age appropriate routine health concerns for screening and prevention, each reviewed and up to date or declined. Immunizations reviewed and up to date or declined. Labs reviewed.  Risk factors for depression reviewed and negative. Hearing function and visual acuity are intact. ADLs screened and addressed as needed. Functional ability and level of safety reviewed and appropriate. Education, counseling and referrals performed based on assessed risks today. Patient provided with a copy of personalized plan for preventive services.   Fatigue - nonspecific symptoms/exam - check screening labs  also see problem list. Medications and labs reviewed today.

## 2013-07-26 NOTE — Progress Notes (Signed)
Pre-visit discussion using our clinic review tool. No additional management support is needed unless otherwise documented below in the visit note.  

## 2013-07-26 NOTE — Patient Instructions (Addendum)
It was good to see you today.  We have reviewed your prior records including labs and tests today  Health Maintenance reviewed - we'll make referral for mammogram screening and colonoscopy screening. You have declined a flu shot -all other recommended immunizations and age-appropriate screenings are up-to-date.  Test(s) ordered today. Your results will be released to MyChart (or called to you) after review, usually within 72hours after test completion. If any changes need to be made, you will be notified at that same time.  Medications reviewed and updated, begin omeprazole once daily for indigestion and stomach symptoms until further evaluation by gastroenterology specialist -no other changes recommended at this time.  Your prescription(s) and amlodipine refill have been submitted to your pharmacy. Please take as directed and contact our office if you believe you are having problem(s) with the medication(s).  we'll make referral to gastroenterologist for colonoscopy screening and evaluation of your reflux for consideration of upper endoscopy. Also refer for mammogram as discussed. Our office will contact you regarding appointment(s) once made.  Please schedule followup in 12 months for annual wellness visit and labs, call sooner if problems.  Health Maintenance, Female A healthy lifestyle and preventative care can promote health and wellness.  Maintain regular health, dental, and eye exams.  Eat a healthy diet. Foods like vegetables, fruits, whole grains, low-fat dairy products, and lean protein foods contain the nutrients you need without too many calories. Decrease your intake of foods high in solid fats, added sugars, and salt. Get information about a proper diet from your caregiver, if necessary.  Regular physical exercise is one of the most important things you can do for your health. Most adults should get at least 150 minutes of moderate-intensity exercise (any activity that increases  your heart rate and causes you to sweat) each week. In addition, most adults need muscle-strengthening exercises on 2 or more days a week.   Maintain a healthy weight. The body mass index (BMI) is a screening tool to identify possible weight problems. It provides an estimate of body fat based on height and weight. Your caregiver can help determine your BMI, and can help you achieve or maintain a healthy weight. For adults 20 years and older:  A BMI below 18.5 is considered underweight.  A BMI of 18.5 to 24.9 is normal.  A BMI of 25 to 29.9 is considered overweight.  A BMI of 30 and above is considered obese.  Maintain normal blood lipids and cholesterol by exercising and minimizing your intake of saturated fat. Eat a balanced diet with plenty of fruits and vegetables. Blood tests for lipids and cholesterol should begin at age 66 and be repeated every 5 years. If your lipid or cholesterol levels are high, you are over 50, or you are a high risk for heart disease, you may need your cholesterol levels checked more frequently.Ongoing high lipid and cholesterol levels should be treated with medicines if diet and exercise are not effective.  If you smoke, find out from your caregiver how to quit. If you do not use tobacco, do not start.  Lung cancer screening is recommended for adults aged 14 80 years who are at high risk for developing lung cancer because of a history of smoking. Yearly low-dose computed tomography (CT) is recommended for people who have at least a 30-pack-year history of smoking and are a current smoker or have quit within the past 15 years. A pack year of smoking is smoking an average of 1 pack of cigarettes  a day for 1 year (for example: 1 pack a day for 30 years or 2 packs a day for 15 years). Yearly screening should continue until the smoker has stopped smoking for at least 15 years. Yearly screening should also be stopped for people who develop a health problem that would prevent  them from having lung cancer treatment.  If you are pregnant, do not drink alcohol. If you are breastfeeding, be very cautious about drinking alcohol. If you are not pregnant and choose to drink alcohol, do not exceed 1 drink per day. One drink is considered to be 12 ounces (355 mL) of beer, 5 ounces (148 mL) of wine, or 1.5 ounces (44 mL) of liquor.  Avoid use of street drugs. Do not share needles with anyone. Ask for help if you need support or instructions about stopping the use of drugs.  High blood pressure causes heart disease and increases the risk of stroke. Blood pressure should be checked at least every 1 to 2 years. Ongoing high blood pressure should be treated with medicines, if weight loss and exercise are not effective.  If you are 75 to 56 years old, ask your caregiver if you should take aspirin to prevent strokes.  Diabetes screening involves taking a blood sample to check your fasting blood sugar level. This should be done once every 3 years, after age 80, if you are within normal weight and without risk factors for diabetes. Testing should be considered at a younger age or be carried out more frequently if you are overweight and have at least 1 risk factor for diabetes.  Breast cancer screening is essential preventative care for women. You should practice "breast self-awareness." This means understanding the normal appearance and feel of your breasts and may include breast self-examination. Any changes detected, no matter how small, should be reported to a caregiver. Women in their 51s and 30s should have a clinical breast exam (CBE) by a caregiver as part of a regular health exam every 1 to 3 years. After age 74, women should have a CBE every year. Starting at age 21, women should consider having a mammogram (breast X-ray) every year. Women who have a family history of breast cancer should talk to their caregiver about genetic screening. Women at a high risk of breast cancer should  talk to their caregiver about having an MRI and a mammogram every year.  Breast cancer gene (BRCA)-related cancer risk assessment is recommended for women who have family members with BRCA-related cancers. BRCA-related cancers include breast, ovarian, tubal, and peritoneal cancers. Having family members with these cancers may be associated with an increased risk for harmful changes (mutations) in the breast cancer genes BRCA1 and BRCA2. Results of the assessment will determine the need for genetic counseling and BRCA1 and BRCA2 testing.  The Pap test is a screening test for cervical cancer. Women should have a Pap test starting at age 92. Between ages 16 and 72, Pap tests should be repeated every 2 years. Beginning at age 69, you should have a Pap test every 3 years as long as the past 3 Pap tests have been normal. If you had a hysterectomy for a problem that was not cancer or a condition that could lead to cancer, then you no longer need Pap tests. If you are between ages 95 and 66, and you have had normal Pap tests going back 10 years, you no longer need Pap tests. If you have had past treatment for cervical cancer or  a condition that could lead to cancer, you need Pap tests and screening for cancer for at least 20 years after your treatment. If Pap tests have been discontinued, risk factors (such as a new sexual partner) need to be reassessed to determine if screening should be resumed. Some women have medical problems that increase the chance of getting cervical cancer. In these cases, your caregiver may recommend more frequent screening and Pap tests.  The human papillomavirus (HPV) test is an additional test that may be used for cervical cancer screening. The HPV test looks for the virus that can cause the cell changes on the cervix. The cells collected during the Pap test can be tested for HPV. The HPV test could be used to screen women aged 22 years and older, and should be used in women of any age who  have unclear Pap test results. After the age of 103, women should have HPV testing at the same frequency as a Pap test.  Colorectal cancer can be detected and often prevented. Most routine colorectal cancer screening begins at the age of 44 and continues through age 68. However, your caregiver may recommend screening at an earlier age if you have risk factors for colon cancer. On a yearly basis, your caregiver may provide home test kits to check for hidden blood in the stool. Use of a small camera at the end of a tube, to directly examine the colon (sigmoidoscopy or colonoscopy), can detect the earliest forms of colorectal cancer. Talk to your caregiver about this at age 14, when routine screening begins. Direct examination of the colon should be repeated every 5 to 10 years through age 25, unless early forms of pre-cancerous polyps or small growths are found.  Hepatitis C blood testing is recommended for all people born from [birth year] through 1964 and any individual with known risks for hepatitis C.  Practice safe sex. Use condoms and avoid high-risk sexual practices to reduce the spread of sexually transmitted infections (STIs). Sexually active women aged 62 and younger should be checked for Chlamydia, which is a common sexually transmitted infection. Older women with new or multiple partners should also be tested for Chlamydia. Testing for other STIs is recommended if you are sexually active and at increased risk.  Osteoporosis is a disease in which the bones lose minerals and strength with aging. This can result in serious bone fractures. The risk of osteoporosis can be identified using a bone density scan. Women ages 1 and over and women at risk for fractures or osteoporosis should discuss screening with their caregivers. Ask your caregiver whether you should be taking a calcium supplement or vitamin D to reduce the rate of osteoporosis.  Menopause can be associated with physical symptoms and risks.  Hormone replacement therapy is available to decrease symptoms and risks. You should talk to your caregiver about whether hormone replacement therapy is right for you.  Use sunscreen. Apply sunscreen liberally and repeatedly throughout the day. You should seek shade when your shadow is shorter than you. Protect yourself by wearing long sleeves, pants, a wide-brimmed hat, and sunglasses year round, whenever you are outdoors.  Notify your caregiver of new moles or changes in moles, especially if there is a change in shape or color. Also notify your caregiver if a mole is larger than the size of a pencil eraser.  Stay current with your immunizations. Document Released: 03/03/2011 Document Revised: 12/13/2012 Document Reviewed: 03/03/2011 [facility] Patient Information 2014 [place], [place]. Gastroesophageal Reflux Disease, Adult Gastroesophageal  reflux disease (GERD) happens when acid from your stomach goes into your food pipe (esophagus). The acid can cause a burning feeling in your chest. Over time, the acid can make small holes (ulcers) in your food pipe.  HOME CARE  Ask your doctor for advice about:  Losing weight.  Quitting smoking.  Alcohol use.  Avoid foods and drinks that make your problems worse. You may want to avoid:  Caffeine and alcohol.  Chocolate.  Mints.  Garlic and onions.  Spicy foods.  Citrus fruits, such as oranges, lemons, or limes.  Foods that contain tomato, such as sauce, chili, salsa, and pizza.  Fried and fatty foods.  Avoid lying down for 3 hours before you go to bed or before you take a nap.  Eat small meals often, instead of large meals.  Wear loose-fitting clothing. Do not wear anything tight around your waist.  Raise (elevate) the head of your bed 6 to 8 inches with wood blocks. Using extra pillows does not help.  Only take medicines as told by your doctor.  Do not take aspirin or ibuprofen. GET HELP RIGHT AWAY IF:   You have pain in your  arms, neck, jaw, teeth, or back.  Your pain gets worse or changes.  You feel sick to your stomach (nauseous), throw up (vomit), or sweat (diaphoresis).  You feel short of breath, or you pass out (faint).  Your throw up is green, yellow, black, or looks like coffee grounds or blood.  Your poop (stool) is red, bloody, or black. MAKE SURE YOU:   Understand these instructions.  Will watch your condition.  Will get help right away if you are not doing well or get worse. Document Released: 02/04/2008 Document Revised: 11/10/2011 Document Reviewed: 03/07/2011 [facility] Patient Information 2014 [place], [place]. Fatigue Fatigue is a feeling of tiredness, lack of energy, lack of motivation, or feeling tired all the time. Having enough rest, good nutrition, and reducing stress will normally reduce fatigue. Consult your caregiver if it persists. The nature of your fatigue will help your caregiver to find out its cause. The treatment is based on the cause.  CAUSES  There are many causes for fatigue. Most of the time, fatigue can be traced to one or more of your habits or routines. Most causes fit into one or more of three general areas. They are: Lifestyle problems  Sleep disturbances.  Overwork.  Physical exertion.  Unhealthy habits.  Poor eating habits or eating disorders.  Alcohol and/or drug use .  Lack of proper nutrition (malnutrition). Psychological problems  Stress and/or anxiety problems.  Depression.  Grief.  Boredom. Medical Problems or Conditions  Anemia.  Pregnancy.  Thyroid gland problems.  Recovery from major surgery.  Continuous pain.  Emphysema or asthma that is not well controlled  Allergic conditions.  Diabetes.  Infections (such as mononucleosis).  Obesity.  Sleep disorders, such as sleep apnea.  Heart failure or other heart-related problems.  Cancer.  Kidney disease.  Liver disease.  Effects of certain medicines such as  antihistamines, cough and cold remedies, prescription pain medicines, heart and blood pressure medicines, drugs used for treatment of cancer, and some antidepressants. SYMPTOMS  The symptoms of fatigue include:   Lack of energy.  Lack of drive (motivation).  Drowsiness.  Feeling of indifference to the surroundings. DIAGNOSIS  The details of how you feel help guide your caregiver in finding out what is causing the fatigue. You will be asked about your present and past health condition. It  is important to review all medicines that you take, including prescription and non-prescription items. A thorough exam will be done. You will be questioned about your feelings, habits, and normal lifestyle. Your caregiver may suggest blood tests, urine tests, or other tests to look for common medical causes of fatigue.  TREATMENT  Fatigue is treated by correcting the underlying cause. For example, if you have continuous pain or depression, treating these causes will improve how you feel. Similarly, adjusting the dose of certain medicines will help in reducing fatigue.  HOME CARE INSTRUCTIONS   Try to get the required amount of good sleep every night.  Eat a healthy and nutritious diet, and drink enough water throughout the day.  Practice ways of relaxing (including yoga or meditation).  Exercise regularly.  Make plans to change situations that cause stress. Act on those plans so that stresses decrease over time. Keep your work and personal routine reasonable.  Avoid street drugs and minimize use of alcohol.  Start taking a daily multivitamin after consulting your caregiver. SEEK MEDICAL CARE IF:   You have persistent tiredness, which cannot be accounted for.  You have fever.  You have unintentional weight loss.  You have headaches.  You have disturbed sleep throughout the night.  You are feeling sad.  You have constipation.  You have dry skin.  You have gained weight.  You are taking  any new or different medicines that you suspect are causing fatigue.  You are unable to sleep at night.  You develop any unusual swelling of your legs or other parts of your body. SEEK IMMEDIATE MEDICAL CARE IF:   You are feeling confused.  Your vision is blurred.  You feel faint or pass out.  You develop severe headache.  You develop severe abdominal, pelvic, or back pain.  You develop chest pain, shortness of breath, or an irregular or fast heartbeat.  You are unable to pass a normal amount of urine.  You develop abnormal bleeding such as bleeding from the rectum or you vomit blood.  You have thoughts about harming yourself or committing suicide.  You are worried that you might harm someone else. MAKE SURE YOU:   Understand these instructions.  Will watch your condition.  Will get help right away if you are not doing well or get worse. Document Released: 06/15/2007 Document Revised: 11/10/2011 Document Reviewed: 06/15/2007 [facility] Patient Information 2014 [place], [place].

## 2013-08-10 ENCOUNTER — Ambulatory Visit (HOSPITAL_COMMUNITY): Payer: BC Managed Care – PPO | Attending: Internal Medicine

## 2013-09-05 ENCOUNTER — Ambulatory Visit: Payer: BC Managed Care – PPO | Admitting: Gastroenterology

## 2013-11-11 ENCOUNTER — Telehealth: Payer: Self-pay | Admitting: *Deleted

## 2013-11-11 DIAGNOSIS — K219 Gastro-esophageal reflux disease without esophagitis: Secondary | ICD-10-CM

## 2013-11-11 NOTE — Telephone Encounter (Signed)
Phoned requesting GI referral to Dr. Larina Brashristian John in [place], [place]   CB# [phone]

## 2013-11-11 NOTE — Addendum Note (Signed)
Addended by: Rene PaciLESCHBER, VALERIE A on: 11/11/2013 05:35 PM   Modules accepted: Orders

## 2013-11-11 NOTE — Telephone Encounter (Signed)
Ok referral ordered 

## 2014-02-28 ENCOUNTER — Telehealth: Payer: Self-pay | Admitting: Internal Medicine

## 2014-02-28 NOTE — Telephone Encounter (Signed)
Received 9 pages from [facility], sent to Dr. Felicity CoyerLeschber. 02/28/14/ss.

## 2014-03-03 LAB — HM COLONOSCOPY

## 2014-03-06 ENCOUNTER — Encounter: Payer: Self-pay | Admitting: Internal Medicine

## 2014-03-14 ENCOUNTER — Encounter: Payer: Self-pay | Admitting: Internal Medicine

## 2014-05-25 ENCOUNTER — Telehealth: Payer: Self-pay | Admitting: Internal Medicine

## 2014-05-25 NOTE — Telephone Encounter (Signed)
Patient is requesting a script for pain in her back.  She is allergic to tramadol.  States Dr. Felicity Coyer has seen her for this issue.  Also patient has an appointment with scoliosis and spin specialist in two weeks.

## 2014-05-25 NOTE — Telephone Encounter (Signed)
Spoke with pt's daughter and offer an appt with another here for this problem due to Dr. Felicity Coyer is out of the office. Pt's daughter will call back

## 2014-07-17 ENCOUNTER — Ambulatory Visit (HOSPITAL_COMMUNITY)
Admission: RE | Admit: 2014-07-17 | Discharge: 2014-07-17 | Disposition: A | Discharge status: Home / Self Care | Payer: BC Managed Care – PPO | Source: Ambulatory Visit | Attending: Internal Medicine | Admitting: Internal Medicine

## 2014-07-17 DIAGNOSIS — Z1231 Encounter for screening mammogram for malignant neoplasm of breast: Secondary | ICD-10-CM | POA: Insufficient documentation

## 2014-07-17 DIAGNOSIS — Z1239 Encounter for other screening for malignant neoplasm of breast: Secondary | ICD-10-CM

## 2014-07-18 ENCOUNTER — Other Ambulatory Visit (INDEPENDENT_AMBULATORY_CARE_PROVIDER_SITE_OTHER): Payer: BC Managed Care – PPO

## 2014-07-18 ENCOUNTER — Ambulatory Visit (INDEPENDENT_AMBULATORY_CARE_PROVIDER_SITE_OTHER): Payer: BC Managed Care – PPO | Admitting: Family

## 2014-07-18 ENCOUNTER — Telehealth: Payer: Self-pay | Admitting: Family

## 2014-07-18 ENCOUNTER — Encounter: Payer: Self-pay | Admitting: Family

## 2014-07-18 VITALS — BP 150/88 | HR 66 | Temp 97.7°F | Resp 18 | Ht 60.0 in | Wt 101.8 lb

## 2014-07-18 DIAGNOSIS — R300 Dysuria: Secondary | ICD-10-CM

## 2014-07-18 DIAGNOSIS — K219 Gastro-esophageal reflux disease without esophagitis: Secondary | ICD-10-CM

## 2014-07-18 DIAGNOSIS — Z0000 Encounter for general adult medical examination without abnormal findings: Secondary | ICD-10-CM

## 2014-07-18 DIAGNOSIS — I10 Essential (primary) hypertension: Secondary | ICD-10-CM

## 2014-07-18 LAB — POCT URINALYSIS DIPSTICK
BILIRUBIN UA: NEGATIVE
GLUCOSE UA: NEGATIVE
Leukocytes, UA: NEGATIVE
NITRITE UA: NEGATIVE
PH UA: 6
Protein, UA: NEGATIVE
RBC UA: NEGATIVE
Urobilinogen, UA: NEGATIVE

## 2014-07-18 LAB — BASIC METABOLIC PANEL
BUN: 16 mg/dL (ref 6–23)
CHLORIDE: 106 meq/L (ref 96–112)
CO2: 20 mEq/L (ref 19–32)
Calcium: 9.7 mg/dL (ref 8.4–10.5)
Creatinine, Ser: 0.8 mg/dL (ref 0.4–1.2)
GFR: 84.44 mL/min (ref >60.00)
Glucose, Bld: 95 mg/dL (ref 70–99)
POTASSIUM: 4.8 meq/L (ref 3.5–5.1)
SODIUM: 140 meq/L (ref 135–145)

## 2014-07-18 LAB — LIPID PANEL
CHOL/HDL RATIO: 4
Cholesterol: 300 mg/dL — ABNORMAL HIGH (ref 0–200)
HDL: 75.6 mg/dL (ref >39.00)
LDL CALC: 212 mg/dL — AB (ref 0–99)
NONHDL: 224.4
TRIGLYCERIDES: 64 mg/dL (ref 0.0–149.0)
VLDL: 12.8 mg/dL (ref 0.0–40.0)

## 2014-07-18 LAB — CBC
HCT: 44.9 % (ref 36.0–46.0)
Hemoglobin: 15.2 g/dL — ABNORMAL HIGH (ref 12.0–15.0)
MCHC: 33.8 g/dL (ref 30.0–36.0)
MCV: 90.8 fl (ref 78.0–100.0)
Platelets: 406 10*3/uL — ABNORMAL HIGH (ref 150.0–400.0)
RBC: 4.95 Mil/uL (ref 3.87–5.11)
RDW: 13.2 % (ref 11.5–15.5)
WBC: 8.9 10*3/uL (ref 4.0–10.5)

## 2014-07-18 LAB — TSH: TSH: 0.78 u[IU]/mL (ref 0.35–4.50)

## 2014-07-18 MED ORDER — AMLODIPINE BESYLATE 2.5 MG PO TABS: 2.5000 mg | ORAL_TABLET | Freq: Every day | ORAL | Status: DC

## 2014-07-18 MED ORDER — OMEPRAZOLE 40 MG PO CPDR: 40.0000 mg | DELAYED_RELEASE_CAPSULE | Freq: Every day | ORAL | Status: DC

## 2014-07-18 MED ORDER — PRAVASTATIN SODIUM 40 MG PO TABS: 40.0000 mg | ORAL_TABLET | Freq: Every day | ORAL | Status: DC

## 2014-07-18 NOTE — Telephone Encounter (Signed)
Please call the patient and inform her that overall her labs look ok. Right now her cholesterol is elevated with a total cholesterol of 300 (goal <200) and bad cholesterol of 212 (goal <100). Therefore I believe it is in her best interest to start a medication for her cholesterol which I have sent to the pharmacy. Please instruct her to let us know if she develops any muscle pain or weakness after starting the medication. Also, her UA was negative and we are awaiting the results of the urine culture as to if I will send in an antibiotic.

## 2014-07-18 NOTE — Assessment & Plan Note (Signed)
Stable on amlodipine. Continue current dosage of amlodipine.

## 2014-07-18 NOTE — Progress Notes (Signed)
Subjective:    Patient ID: Danielle Terry, female    DOB: 08/09/1956, 58 y.o.   MRN: [id number]  Chief Complaint  Patient presents with  . CPE    Wants EKG done   HPI:  Danielle Terry is a 58 y.o. female who presents today for an annual wellness visit.  1) Health Maintenance -   Diet - Eating vegetables and lean meats  Exercise - Trying to walk around the house 1-2x per day  Preventative Exams / Immunizations:  Dental -- Due for exam Vision -- Up to date  Health Maintenance  Topic Date Due  . TETANUS/TDAP  11/04/1975  . INFLUENZA VACCINE  04/01/2014  . PAP SMEAR  03/01/2016  . MAMMOGRAM  07/17/2016  . COLONOSCOPY  03/03/2024   Declines flu and tetanus shot.   2) GERD - currently maintained on omeprazole 40 mg.   3) Hypertension - currently maintained on amlodipine. Denies any changes in vision, chest pain/discomfort, shortness of breath, palpitations, or edema.   BP Readings from Last 3 Encounters:  07/18/14 150/88  07/26/13 134/82  02/24/13 130/82   Allergies  Allergen Reactions  . Tramadol    No current outpatient prescriptions on file prior to visit.   No current facility-administered medications on file prior to visit.   Past Medical History  Diagnosis Date  . Hypertension   . MENOPAUSAL SYNDROME   . Vertigo   . GERD (gastroesophageal reflux disease)   . ECZEMA    Family History  Problem Relation Age of Onset  . Breast cancer Other   . Hypertension Other    History   Social History  . Marital Status: Married    Spouse Name: N/A    Number of Children: N/A  . Years of Education: N/A   Occupational History  . Not on file.   Social History Main Topics  . Smoking status: Never Smoker   . 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 Alcohol Use: No  . Drug Use: No  . Sexual Activity: Not on file   Other Topics Concern  . 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 Son and daughter speak English and her involved in patient's care    Review of Systems   Constitutional: Denies fever, chills, fatigue, or significant weight gain/loss. HENT: Head: Denies headache or neck pain Ears: Denies changes in hearing, ringing in ears, earache, drainage Nose: Denies discharge, stuffiness, itching, nosebleed, sinus pain Throat: Denies sore throat, hoarseness, dry mouth, sores, thrush Eyes: Denies loss/changes in vision, pain, redness, blurry/double vision, flashing lights Cardiovascular: Denies chest pain/discomfort, tightness, palpitations, shortness of breath with activity, difficulty lying down, swelling, sudden awakening with shortness of breath Respiratory: Denies shortness of breath, cough, sputum production, wheezing Gastrointestinal: Denies dysphasia, heartburn, change in appetite, nausea, change in bowel habits, rectal bleeding, constipation, diarrhea, yellow skin or eyes Genitourinary: Denies frequency, urgency, blood in urine, incontinence, change in urinary strength. Describes some burning during urination.  Musculoskeletal: Denies muscle/joint pain, stiffness, back pain, redness or swelling of joints, trauma Skin: Denies rashes, lumps, itching, dryness, color changes, or hair/nail changes Neurological: Denies dizziness, fainting, seizures, weakness, numbness, tingling, tremor Psychiatric - Denies nervousness, stress, depression or memory loss Endocrine: Denies heat or cold intolerance, sweating, frequent urination, excessive thirst, changes in appetite Hematologic: Denies ease of bruising  or bleeding    Objective:    BP 150/88 mmHg  Pulse 66  Temp(Src) 97.7 F (36.5 C) (Oral)  Resp 18  Ht 5' (1.524 m)  Wt 101 lb 12.8 oz (46.176 kg)  BMI 19.88 kg/m2  SpO2 98% Nursing note and vital signs reviewed.  Physical Exam  Constitutional: She is oriented to person, place, and time. She  appears well-developed and well-nourished.  HENT:  Head: Normocephalic.  Right Ear: Hearing, tympanic membrane, external ear and ear canal normal.  Left Ear: Hearing, tympanic membrane, external ear and ear canal normal.  Nose: Nose normal.  Mouth/Throat: Uvula is midline, oropharynx is clear and moist and mucous membranes are normal.  Eyes: Conjunctivae and EOM are normal. Pupils are equal, round, and reactive to light.  Neck: Neck supple. No JVD present. No tracheal deviation present. No thyromegaly present.  Cardiovascular: Normal rate, regular rhythm, normal heart sounds and intact distal pulses.   Pulmonary/Chest: Effort normal and breath sounds normal.  Abdominal: Soft. Bowel sounds are normal. She exhibits no distension and no mass. There is no tenderness. There is no rebound and no guarding.  Musculoskeletal: She exhibits no edema or tenderness.  Left sided chronic back pain which radiates down her left leg. Limits hip flexion and lumbar flexion.   Lymphadenopathy:    She has no cervical adenopathy.  Neurological: She is alert and oriented to person, place, and time. She has normal reflexes. No cranial nerve deficit. She exhibits normal muscle tone. Coordination normal.  Skin: Skin is warm and dry.  Psychiatric: She has a normal mood and affect. Her behavior is normal. Judgment and thought content normal.       Assessment & Plan:

## 2014-07-18 NOTE — Assessment & Plan Note (Signed)
Symptoms come and go without frequency and urgency. Check UA and UC. Start antibiotic pending culture.

## 2014-07-18 NOTE — Progress Notes (Signed)
Pre visit review using our clinic review tool, if applicable. No additional management support is needed unless otherwise documented below in the visit note. 

## 2014-07-18 NOTE — Patient Instructions (Addendum)
Thank you for choosing [facility].  Summary/Instructions:  Your prescription(s) have been submitted to your pharmacy. Please take as directed and contact our office if you believe you are having problem(s) with the medication(s).  Health Maintenance Adopting a healthy lifestyle and getting preventive care can go a long way to promote health and wellness. Talk with your health care provider about what schedule of regular examinations is right for you. This is a good chance for you to check in with your provider about disease prevention and staying healthy. In between checkups, there are plenty of things you can do on your own. Experts have done a lot of research about which lifestyle changes and preventive measures are most likely to keep you healthy. Ask your health care provider for more information. WEIGHT AND DIET  Eat a healthy diet  Be sure to include plenty of vegetables, fruits, low-fat dairy products, and lean protein.  Do not eat a lot of foods high in solid fats, added sugars, or salt.  Get regular exercise. This is one of the most important things you can do for your health.  Most adults should exercise for at least 150 minutes each week. The exercise should increase your heart rate and make you sweat (moderate-intensity exercise).  Most adults should also do strengthening exercises at least twice a week. This is in addition to the moderate-intensity exercise.  Maintain a healthy weight  Body mass index (BMI) is a measurement that can be used to identify possible weight problems. It estimates body fat based on height and weight. Your health care provider can help determine your BMI and help you achieve or maintain a healthy weight.  For females 20 years of age and older:   A BMI below 18.5 is considered underweight.  A BMI of 18.5 to 24.9 is normal.  A BMI of 25 to 29.9 is considered overweight.  A BMI of 30 and above is considered obese.  Watch levels of  cholesterol and blood lipids  You should start having your blood tested for lipids and cholesterol at 57 years of age, then have this test every 5 years.  You may need to have your cholesterol levels checked more often if:  Your lipid or cholesterol levels are high.  You are older than 57 years of age.  You are at high risk for heart disease.  CANCER SCREENING   Lung Cancer  Lung cancer screening is recommended for adults 55-80 years old who are at high risk for lung cancer because of a history of smoking.  A yearly low-dose CT scan of the lungs is recommended for people who:  Currently smoke.  Have quit within the past 15 years.  Have at least a 30-pack-year history of smoking. A pack year is smoking an average of one pack of cigarettes a day for 1 year.  Yearly screening should continue until it has been 15 years since you quit.  Yearly screening should stop if you develop a health problem that would prevent you from having lung cancer treatment.  Breast Cancer  Practice breast self-awareness. This means understanding how your breasts normally appear and feel.  It also means doing regular breast self-exams. Let your health care provider know about any changes, no matter how small.  If you are in your 20s or 30s, you should have a clinical breast exam (CBE) by a health care provider every 1-3 years as part of a regular health exam.  If you are 40 or older, have   a CBE every year. Also consider having a breast X-ray (mammogram) every year.  If you have a family history of breast cancer, talk to your health care provider about genetic screening.  If you are at high risk for breast cancer, talk to your health care provider about having an MRI and a mammogram every year.  Breast cancer gene (BRCA) assessment is recommended for women who have family members with BRCA-related cancers. BRCA-related cancers include:  Breast.  Ovarian.  Tubal.  Peritoneal  cancers.  Results of the assessment will determine the need for genetic counseling and BRCA1 and BRCA2 testing. Cervical Cancer Routine pelvic examinations to screen for cervical cancer are no longer recommended for nonpregnant women who are considered low risk for cancer of the pelvic organs (ovaries, uterus, and vagina) and who do not have symptoms. A pelvic examination may be necessary if you have symptoms including those associated with pelvic infections. Ask your health care provider if a screening pelvic exam is right for you.   The Pap test is the screening test for cervical cancer for women who are considered at risk.  If you had a hysterectomy for a problem that was not cancer or a condition that could lead to cancer, then you no longer need Pap tests.  If you are older than 65 years, and you have had normal Pap tests for the past 10 years, you no longer need to have Pap tests.  If you have had past treatment for cervical cancer or a condition that could lead to cancer, you need Pap tests and screening for cancer for at least 20 years after your treatment.  If you no longer get a Pap test, assess your risk factors if they change (such as having a new sexual partner). This can affect whether you should start being screened again.  Some women have medical problems that increase their chance of getting cervical cancer. If this is the case for you, your health care provider may recommend more frequent screening and Pap tests.  The human papillomavirus (HPV) test is another test that may be used for cervical cancer screening. The HPV test looks for the virus that can cause cell changes in the cervix. The cells collected during the Pap test can be tested for HPV.  The HPV test can be used to screen women 30 years of age and older. Getting tested for HPV can extend the interval between normal Pap tests from three to five years.  An HPV test also should be used to screen women of any age who  have unclear Pap test results.  After 57 years of age, women should have HPV testing as often as Pap tests.  Colorectal Cancer  This type of cancer can be detected and often prevented.  Routine colorectal cancer screening usually begins at 57 years of age and continues through 57 years of age.  Your health care provider may recommend screening at an earlier age if you have risk factors for colon cancer.  Your health care provider may also recommend using home test kits to check for hidden blood in the stool.  A small camera at the end of a tube can be used to examine your colon directly (sigmoidoscopy or colonoscopy). This is done to check for the earliest forms of colorectal cancer.  Routine screening usually begins at age 50.  Direct examination of the colon should be repeated every 5-10 years through 57 years of age. However, you may need to be screened more   often if early forms of precancerous polyps or small growths are found. Skin Cancer  Check your skin from head to toe regularly.  Tell your health care provider about any new moles or changes in moles, especially if there is a change in a mole's shape or color.  Also tell your health care provider if you have a mole that is larger than the size of a pencil eraser.  Always use sunscreen. Apply sunscreen liberally and repeatedly throughout the day.  Protect yourself by wearing long sleeves, pants, a wide-brimmed hat, and sunglasses whenever you are outside. HEART DISEASE, DIABETES, AND HIGH BLOOD PRESSURE   Have your blood pressure checked at least every 1-2 years. High blood pressure causes heart disease and increases the risk of stroke.  If you are between 55 years and 79 years old, ask your health care provider if you should take aspirin to prevent strokes.  Have regular diabetes screenings. This involves taking a blood sample to check your fasting blood sugar level.  If you are at a normal weight and have a low risk for  diabetes, have this test once every three years after 57 years of age.  If you are overweight and have a high risk for diabetes, consider being tested at a younger age or more often. PREVENTING INFECTION  Hepatitis B  If you have a higher risk for hepatitis B, you should be screened for this virus. You are considered at high risk for hepatitis B if:  You were born in a country where hepatitis B is common. Ask your health care provider which countries are considered high risk.  Your parents were born in a high-risk country, and you have not been immunized against hepatitis B (hepatitis B vaccine).  You have HIV or AIDS.  You use needles to inject street drugs.  You live with someone who has hepatitis B.  You have had sex with someone who has hepatitis B.  You get hemodialysis treatment.  You take certain medicines for conditions, including cancer, organ transplantation, and autoimmune conditions. Hepatitis C  Blood testing is recommended for:  Everyone born from 1945 through 1965.  Anyone with known risk factors for hepatitis C. Sexually transmitted infections (STIs)  You should be screened for sexually transmitted infections (STIs) including gonorrhea and chlamydia if:  You are sexually active and are younger than 57 years of age.  You are older than 57 years of age and your health care provider tells you that you are at risk for this type of infection.  Your sexual activity has changed since you were last screened and you are at an increased risk for chlamydia or gonorrhea. Ask your health care provider if you are at risk.  If you do not have HIV, but are at risk, it may be recommended that you take a prescription medicine daily to prevent HIV infection. This is called pre-exposure prophylaxis (PrEP). You are considered at risk if:  You are sexually active and do not regularly use condoms or know the HIV status of your partner(s).  You take drugs by injection.  You are  sexually active with a partner who has HIV. Talk with your health care provider about whether you are at high risk of being infected with HIV. If you choose to begin PrEP, you should first be tested for HIV. You should then be tested every 3 months for as long as you are taking PrEP.  PREGNANCY   If you are premenopausal and you may become   pregnant, ask your health care provider about preconception counseling.  If you may become pregnant, take 400 to 800 micrograms (mcg) of folic acid every day.  If you want to prevent pregnancy, talk to your health care provider about birth control (contraception). OSTEOPOROSIS AND MENOPAUSE   Osteoporosis is a disease in which the bones lose minerals and strength with aging. This can result in serious bone fractures. Your risk for osteoporosis can be identified using a bone density scan.  If you are 65 years of age or older, or if you are at risk for osteoporosis and fractures, ask your health care provider if you should be screened.  Ask your health care provider whether you should take a calcium or vitamin D supplement to lower your risk for osteoporosis.  Menopause may have certain physical symptoms and risks.  Hormone replacement therapy may reduce some of these symptoms and risks. Talk to your health care provider about whether hormone replacement therapy is right for you.  HOME CARE INSTRUCTIONS   Schedule regular health, dental, and eye exams.  Stay current with your immunizations.   Do not use any tobacco products including cigarettes, chewing tobacco, or electronic cigarettes.  If you are pregnant, do not drink alcohol.  If you are breastfeeding, limit how much and how often you drink alcohol.  Limit alcohol intake to no more than 1 drink per day for nonpregnant women. One drink equals 12 ounces of beer, 5 ounces of wine, or 1 ounces of hard liquor.  Do not use street drugs.  Do not share needles.  Ask your health care provider for  help if you need support or information about quitting drugs.  Tell your health care provider if you often feel depressed.  Tell your health care provider if you have ever been abused or do not feel safe at home. Document Released: 03/03/2011 Document Revised: 01/02/2014 Document Reviewed: 07/20/2013 ExitCare Patient Information 2015 [employer]. This information is not intended to replace advice given to you by your health care provider. Make sure you discuss any questions you have with your health care provider.  

## 2014-07-18 NOTE — Assessment & Plan Note (Signed)
Stable with no exacerbations or side effects of mediation. Continue current dosage of omeprazole.

## 2014-07-18 NOTE — Assessment & Plan Note (Addendum)
1) Anticipatory Guidance: Discussed importance of wearing a seatbelt while driving and not texting while driving; changing batteries in smoke detector at least once annually; wearing suntan lotion when outside; eating a balanced and moderate diet; getting physical activity at least 30 minutes per day.  2) Immunizations / Screenings / Labs:  Discussed importance of immunizations - pt refusing tetanus and flu shot at this time. Is overdue for a dental visit. Other screening tests have been completed. Obtain TSH, Lipid profile, CBC, BMET  Overall well exam. EKG shows normal sinus rhythm. Continues to have some difficulty with her back pain, however appears to be managing well. Continue healthy habits. Increase exercise as able. Follow up in 1 year or sooner if needed for prevention exam.

## 2014-07-19 LAB — URINE CULTURE
COLONY COUNT: NO GROWTH
Organism ID, Bacteria: NO GROWTH

## 2014-07-19 NOTE — Telephone Encounter (Signed)
Tried calling pt with results. No answer. Left message for pt to call back.

## 2014-07-20 NOTE — Telephone Encounter (Signed)
Tried to call pt again. No answer. Mailing lab results.

## 2015-01-22 ENCOUNTER — Ambulatory Visit (INDEPENDENT_AMBULATORY_CARE_PROVIDER_SITE_OTHER): Payer: 59 | Admitting: Internal Medicine

## 2015-01-22 ENCOUNTER — Other Ambulatory Visit (INDEPENDENT_AMBULATORY_CARE_PROVIDER_SITE_OTHER): Payer: 59

## 2015-01-22 ENCOUNTER — Encounter: Payer: Self-pay | Admitting: Internal Medicine

## 2015-01-22 VITALS — BP 140/92 | HR 59 | Temp 98.5°F | Resp 12 | Ht 62.0 in | Wt 94.4 lb

## 2015-01-22 DIAGNOSIS — E785 Hyperlipidemia, unspecified: Secondary | ICD-10-CM | POA: Insufficient documentation

## 2015-01-22 DIAGNOSIS — I10 Essential (primary) hypertension: Secondary | ICD-10-CM | POA: Diagnosis not present

## 2015-01-22 LAB — LIPID PANEL
CHOL/HDL RATIO: 3
Cholesterol: 197 mg/dL (ref 0–200)
HDL: 63.9 mg/dL (ref >39.00)
LDL CALC: 112 mg/dL — AB (ref 0–99)
NONHDL: 133.1
Triglycerides: 107 mg/dL (ref 0.0–149.0)
VLDL: 21.4 mg/dL (ref 0.0–40.0)

## 2015-01-22 LAB — BASIC METABOLIC PANEL
BUN: 10 mg/dL (ref 6–23)
CO2: 28 meq/L (ref 19–32)
Calcium: 9.8 mg/dL (ref 8.4–10.5)
Chloride: 103 mEq/L (ref 96–112)
Creatinine, Ser: 0.63 mg/dL (ref 0.40–1.20)
GFR: 103.08 mL/min (ref >60.00)
GLUCOSE: 89 mg/dL (ref 70–99)
POTASSIUM: 4.2 meq/L (ref 3.5–5.1)
Sodium: 139 mEq/L (ref 135–145)

## 2015-01-22 MED ORDER — AMLODIPINE BESYLATE 5 MG PO TABS: 5.0000 mg | ORAL_TABLET | Freq: Every day | ORAL | Status: DC

## 2015-01-22 NOTE — Patient Instructions (Signed)
We will recheck the cholesterol today and see if the numbers look any better. If they do not we may recommend trying the medicine for it as cholesterol as high as yours can put you at risk for heart attack and stroke.   Keep up the good work with exercising for 30 minutes at least 3 times per week.   We will check your blood pressure machine and see if the numbers can match what we are getting. If not the machine may not be very accurate.   Come back in about 6 months for a check up of the cholesterol and blood pressure.   Fat and Cholesterol Control Diet Fat and cholesterol levels in your blood and organs are influenced by your diet. High levels of fat and cholesterol may lead to diseases of the heart, small and large blood vessels, gallbladder, liver, and pancreas. CONTROLLING FAT AND CHOLESTEROL WITH DIET Although exercise and lifestyle factors are important, your diet is key. That is because certain foods are known to raise cholesterol and others to lower it. The goal is to balance foods for their effect on cholesterol and more importantly, to replace saturated and trans fat with other types of fat, such as monounsaturated fat, polyunsaturated fat, and omega-3 fatty acids. On average, a person should consume no more than 15 to 17 g of saturated fat daily. Saturated and trans fats are considered "bad" fats, and they will raise LDL cholesterol. Saturated fats are primarily found in animal products such as meats, butter, and cream. However, that does not mean you need to give up all your favorite foods. Today, there are good tasting, low-fat, low-cholesterol substitutes for most of the things you like to eat. Choose low-fat or nonfat alternatives. Choose round or loin cuts of red meat. These types of cuts are lowest in fat and cholesterol. Chicken (without the skin), fish, veal, and ground [place] breast are great choices. Eliminate fatty meats, such as hot dogs and salami. Even shellfish have little or  no saturated fat. Have a 3 oz (85 g) portion when you eat lean meat, poultry, or fish. Trans fats are also called "partially hydrogenated oils." They are oils that have been scientifically manipulated so that they are solid at room temperature resulting in a longer shelf life and improved taste and texture of foods in which they are added. Trans fats are found in stick margarine, some tub margarines, cookies, crackers, and baked goods.  When baking and cooking, oils are a great substitute for butter. The monounsaturated oils are especially beneficial since it is believed they lower LDL and raise HDL. The oils you should avoid entirely are saturated tropical oils, such as coconut and palm.  Remember to eat a lot from food groups that are naturally free of saturated and trans fat, including fish, fruit, vegetables, beans, grains (barley, rice, couscous, bulgur wheat), and pasta (without cream sauces).  IDENTIFYING FOODS THAT LOWER FAT AND CHOLESTEROL  Soluble fiber may lower your cholesterol. This type of fiber is found in fruits such as apples, vegetables such as broccoli, potatoes, and carrots, legumes such as beans, peas, and lentils, and grains such as barley. Foods fortified with plant sterols (phytosterol) may also lower cholesterol. You should eat at least 2 g per day of these foods for a cholesterol lowering effect.  Read package labels to identify low-saturated fats, trans fat free, and low-fat foods at the supermarket. Select cheeses that have only 2 to 3 g saturated fat per ounce. Use a  heart-healthy tub margarine that is free of trans fats or partially hydrogenated oil. When buying baked goods (cookies, crackers), avoid partially hydrogenated oils. Breads and muffins should be made from whole grains (whole-wheat or whole oat flour, instead of "flour" or "enriched flour"). Buy non-creamy canned soups with reduced salt and no added fats.  FOOD PREPARATION TECHNIQUES  Never deep-fry. If you must fry,  either stir-fry, which uses very little fat, or use non-stick cooking sprays. When possible, broil, bake, or roast meats, and steam vegetables. Instead of putting butter or margarine on vegetables, use lemon and herbs, applesauce, and cinnamon (for squash and sweet potatoes). Use nonfat yogurt, salsa, and low-fat dressings for salads.  LOW-SATURATED FAT / LOW-FAT FOOD SUBSTITUTES Meats / Saturated Fat (g)  Avoid: Steak, marbled (3 oz/85 g) / 11 g  Choose: Steak, lean (3 oz/85 g) / 4 g  Avoid: Hamburger (3 oz/85 g) / 7 g  Choose: Hamburger, lean (3 oz/85 g) / 5 g  Avoid: Ham (3 oz/85 g) / 6 g  Choose: Ham, lean cut (3 oz/85 g) / 2.4 g  Avoid: Chicken, with skin, dark meat (3 oz/85 g) / 4 g  Choose: Chicken, skin removed, dark meat (3 oz/85 g) / 2 g  Avoid: Chicken, with skin, light meat (3 oz/85 g) / 2.5 g  Choose: Chicken, skin removed, light meat (3 oz/85 g) / 1 g Dairy / Saturated Fat (g)  Avoid: Whole milk (1 cup) / 5 g  Choose: Low-fat milk, 2% (1 cup) / 3 g  Choose: Low-fat milk, 1% (1 cup) / 1.5 g  Choose: Skim milk (1 cup) / 0.3 g  Avoid: Hard cheese (1 oz/28 g) / 6 g  Choose: Skim milk cheese (1 oz/28 g) / 2 to 3 g  Avoid: Cottage cheese, 4% fat (1 cup) / 6.5 g  Choose: Low-fat cottage cheese, 1% fat (1 cup) / 1.5 g  Avoid: Ice cream (1 cup) / 9 g  Choose: Sherbet (1 cup) / 2.5 g  Choose: Nonfat frozen yogurt (1 cup) / 0.3 g  Choose: Frozen fruit bar / trace  Avoid: Whipped cream (1 tbs) / 3.5 g  Choose: Nondairy whipped topping (1 tbs) / 1 g Condiments / Saturated Fat (g)  Avoid: Mayonnaise (1 tbs) / 2 g  Choose: Low-fat mayonnaise (1 tbs) / 1 g  Avoid: Butter (1 tbs) / 7 g  Choose: Extra light margarine (1 tbs) / 1 g  Avoid: Coconut oil (1 tbs) / 11.8 g  Choose: Olive oil (1 tbs) / 1.8 g  Choose: Corn oil (1 tbs) / 1.7 g  Choose: Safflower oil (1 tbs) / 1.2 g  Choose: Sunflower oil (1 tbs) / 1.4 g  Choose: Soybean oil (1 tbs) / 2.4  g  Choose: Canola oil (1 tbs) / 1 g Document Released: 08/18/2005 Document Revised: 12/13/2012 Document Reviewed: 11/16/2013 ExitCare Patient Information 2015 [place] [place]. This information is not intended to replace advice given to you by your health care provider. Make sure you discuss any questions you have with your health care provider.

## 2015-01-22 NOTE — Assessment & Plan Note (Signed)
Goal LDL <130 and last was 212. She was prescribed pravachol which she did not take. She has worked on her diet and exercising more at home. Will recheck today and talked to her about starting the medicine if no improvement. Likely will need the medications since she was so far from her goal.

## 2015-01-22 NOTE — Assessment & Plan Note (Addendum)
BP borderline high or high at last two visits and will increase the amlodipine from 2.5 mg daily to 5 mg daily. She will keep checking at home and let us know if it is still high. Checking BMP today. Her home monitor was checked today at visit and cuff size appropriate and reading done which was fairly similar to the comparison of our check here in the office.

## 2015-01-22 NOTE — Progress Notes (Signed)
Pre visit review using our clinic review tool, if applicable. No additional management support is needed unless otherwise documented below in the visit note. 

## 2015-01-22 NOTE — Progress Notes (Signed)
   Subjective:    Patient ID: Danielle Terry, female    DOB: 02/21/1956, 59 y.o.   MRN: [id number]  HPI The patient is a 59 YO female who is here for follow up of her blood pressure and high cholesterol. Last visit her LDL was 212 and she was prescribed cholesterol medication. She has not taken that and has tried to increase exercise. Her diet is already very good and she is selective about her diet. While here for her physical BP was a little high. She has continued to take her medication. Her machine at home sometimes says 120 and sometimes is 160. She is not sure if it is working. Denies headache, chest pains, swelling, numbness.   Review of Systems  Constitutional: Positive for activity change. Negative for fever, appetite change, fatigue and unexpected weight change.       Exercising more  Respiratory: Negative for cough, chest tightness, shortness of breath and wheezing.   Cardiovascular: Negative for chest pain, palpitations and leg swelling.  Gastrointestinal: Negative for nausea, abdominal pain, diarrhea, constipation and abdominal distention.  Musculoskeletal: Negative.   Skin: Negative.   Neurological: Negative.       Objective:   Physical Exam  Constitutional: She appears well-developed and well-nourished.  HENT:  Head: Normocephalic and atraumatic.  Eyes: EOM are normal.  Neck: Normal range of motion.  Cardiovascular: Normal rate and regular rhythm.   Pulmonary/Chest: Effort normal and breath sounds normal. No respiratory distress. She has no wheezes. She has no rales.  Abdominal: Soft. She exhibits no distension. There is no tenderness. There is no rebound.  Musculoskeletal: She exhibits no edema.  Neurological: Coordination normal.  Skin: Skin is warm and dry.   Filed Vitals:   01/22/15 0920  BP: 142/90  Pulse: 59  Temp: 98.5 F (36.9 C)  TempSrc: Oral  Resp: 12  Height: 5\' 2"  (1.575 m)  Weight: 94 lb 6.4 oz (42.82 kg)  SpO2: 97%      Assessment & Plan:

## 2015-07-25 ENCOUNTER — Ambulatory Visit: Payer: 59 | Admitting: Internal Medicine

## 2015-08-03 ENCOUNTER — Other Ambulatory Visit (INDEPENDENT_AMBULATORY_CARE_PROVIDER_SITE_OTHER): Payer: 59

## 2015-08-03 ENCOUNTER — Ambulatory Visit (INDEPENDENT_AMBULATORY_CARE_PROVIDER_SITE_OTHER): Payer: 59 | Admitting: Internal Medicine

## 2015-08-03 ENCOUNTER — Encounter: Payer: Self-pay | Admitting: Internal Medicine

## 2015-08-03 VITALS — BP 138/80 | HR 69 | Temp 98.2°F | Resp 12 | Ht 60.0 in | Wt 92.0 lb

## 2015-08-03 DIAGNOSIS — H8110 Benign paroxysmal vertigo, unspecified ear: Secondary | ICD-10-CM | POA: Diagnosis not present

## 2015-08-03 DIAGNOSIS — R5383 Other fatigue: Secondary | ICD-10-CM

## 2015-08-03 DIAGNOSIS — E785 Hyperlipidemia, unspecified: Secondary | ICD-10-CM

## 2015-08-03 LAB — CBC
HCT: 42.6 % (ref 36.0–46.0)
HEMOGLOBIN: 14.5 g/dL (ref 12.0–15.0)
MCHC: 34 g/dL (ref 30.0–36.0)
MCV: 90.8 fl (ref 78.0–100.0)
Platelets: 304 10*3/uL (ref 150.0–400.0)
RBC: 4.7 Mil/uL (ref 3.87–5.11)
RDW: 12.6 % (ref 11.5–15.5)
WBC: 6.9 10*3/uL (ref 4.0–10.5)

## 2015-08-03 LAB — LIPID PANEL
Cholesterol: 200 mg/dL (ref 0–200)
HDL: 63.8 mg/dL (ref >39.00)
LDL CALC: 126 mg/dL — AB (ref 0–99)
NONHDL: 136.16
Total CHOL/HDL Ratio: 3
Triglycerides: 53 mg/dL (ref 0.0–149.0)
VLDL: 10.6 mg/dL (ref 0.0–40.0)

## 2015-08-03 LAB — VITAMIN B12: VITAMIN B 12: 381 pg/mL (ref 211–911)

## 2015-08-03 LAB — COMPREHENSIVE METABOLIC PANEL
ALK PHOS: 137 U/L — AB (ref 39–117)
ALT: 14 U/L (ref 0–35)
AST: 20 U/L (ref 0–37)
Albumin: 4.1 g/dL (ref 3.5–5.2)
BUN: 14 mg/dL (ref 6–23)
CHLORIDE: 104 meq/L (ref 96–112)
CO2: 27 meq/L (ref 19–32)
Calcium: 9.6 mg/dL (ref 8.4–10.5)
Creatinine, Ser: 0.56 mg/dL (ref 0.40–1.20)
GFR: 117.87 mL/min (ref >60.00)
GLUCOSE: 88 mg/dL (ref 70–99)
POTASSIUM: 4.3 meq/L (ref 3.5–5.1)
SODIUM: 141 meq/L (ref 135–145)
Total Bilirubin: 0.7 mg/dL (ref 0.2–1.2)
Total Protein: 7.6 g/dL (ref 6.0–8.3)

## 2015-08-03 LAB — TSH: TSH: 1.05 u[IU]/mL (ref 0.35–4.50)

## 2015-08-03 LAB — FOLATE: FOLATE: 18.2 ng/mL (ref >5.9)

## 2015-08-03 MED ORDER — NEOMYCIN-POLYMYXIN-HC 1 % OT SOLN: 3.0000 [drp] | Freq: Three times a day (TID) | OTIC | Status: DC

## 2015-08-03 MED ORDER — OMEPRAZOLE 20 MG PO CPDR: 20.0000 mg | DELAYED_RELEASE_CAPSULE | Freq: Two times a day (BID) | ORAL | Status: DC

## 2015-08-03 NOTE — Patient Instructions (Signed)
We have sent in an ear drop for the dizzy spells. Use 3 drops in the right ear twice a day for 1 week.   We are checking the blood work today for the cholesterol and looking for vitamin levels that could be causing the tiredness.   Come back in about 6 months for a physical.

## 2015-08-03 NOTE — Assessment & Plan Note (Signed)
She is still not taking cholesterol medicine and rechecking her levels. Previously very high and she was encouraged again to start cholesterol medicine to reduce her risk of heart attack and stroke.

## 2015-08-03 NOTE — Progress Notes (Signed)
   Subjective:    Patient ID: Danielle Terry, female    DOB: 01/29/1956, 59 y.o.   MRN: [id number]  HPI The patient is a 59 YO female coming in with several concerns. She is having episodes of dizziness where the room spins. Lasts about 5 minutes and clears with rest. Not predictable and not when standing from sitting. She denies feeling like she would pass out. No change to diet or exercise recently. Some mild ear pain on the right.  Next concern is feeling very tired during the day. She does sleep well at night usually and wakes feeling rested. Needs to nap during the day. Denies snoring. Has been going on for some time. No change to her medicines recently. No confusion or memory problems.   Review of Systems  Constitutional: Positive for fatigue. Negative for fever, activity change, appetite change and unexpected weight change.  Respiratory: Negative for cough, chest tightness, shortness of breath and wheezing.   Cardiovascular: Negative for chest pain, palpitations and leg swelling.  Gastrointestinal: Negative for nausea, abdominal pain, diarrhea, constipation and abdominal distention.  Musculoskeletal: Negative.   Skin: Negative.   Neurological: Positive for dizziness. Negative for syncope, weakness and numbness.      Objective:   Physical Exam  Constitutional: She appears well-developed and well-nourished.  HENT:  Head: Normocephalic and atraumatic.  Right ear with otitis externa  Eyes: EOM are normal.  Neck: Normal range of motion.  Cardiovascular: Normal rate and regular rhythm.   Pulmonary/Chest: Effort normal and breath sounds normal. No respiratory distress. She has no wheezes. She has no rales.  Abdominal: Soft. She exhibits no distension. There is no tenderness. There is no rebound.  Musculoskeletal: She exhibits no edema.  Neurological: Coordination normal.  Skin: Skin is warm and dry.   Filed Vitals:   08/03/15 1034  BP: 138/80  Pulse: 69  Temp: 98.2 F (36.8 C)    TempSrc: Oral  Resp: 12  Height: 5' (1.524 m)  Weight: 92 lb (41.731 kg)  SpO2: 97%      Assessment & Plan:

## 2015-08-03 NOTE — Assessment & Plan Note (Signed)
Checking thyroid levels and vitamin levels for deficiency. Body habitus not typical for OSA and no snoring at night time.

## 2015-08-03 NOTE — Progress Notes (Signed)
Pre visit review using our clinic review tool, if applicable. No additional management support is needed unless otherwise documented below in the visit note. 

## 2015-08-03 NOTE — Assessment & Plan Note (Signed)
Will treat her otitis externa to see if this helps with her dizziness. Overall mild symptoms and infrequent.

## 2015-08-06 ENCOUNTER — Telehealth: Payer: Self-pay | Admitting: *Deleted

## 2015-08-06 ENCOUNTER — Other Ambulatory Visit: Payer: Self-pay | Admitting: Family

## 2015-08-06 MED ORDER — AMLODIPINE BESYLATE 5 MG PO TABS: 5.0000 mg | ORAL_TABLET | Freq: Every day | ORAL | Status: DC

## 2015-08-06 MED ORDER — OMEPRAZOLE 20 MG PO CPDR: 20.0000 mg | DELAYED_RELEASE_CAPSULE | Freq: Two times a day (BID) | ORAL | Status: DC

## 2015-08-06 NOTE — Telephone Encounter (Signed)
Son states mom forgot to get refill on her amlodipine on Friday. Also she received ear drops nut not the omepazole. Inform him md sent omepazole too on Friday will resend...Raechel Chute/lmb

## 2015-08-07 NOTE — Telephone Encounter (Signed)
Son call back he states mom is wanting 90 day sent on her omeprazole rather tahn 1 month. Inform will resend for #90...Raechel Chute/lmb

## 2015-08-07 NOTE — Addendum Note (Signed)
Addended by: Deatra JamesBRAND, Jakeisha Stricker M on: 08/07/2015 12:13 PM   Modules accepted: Orders

## 2015-09-12 ENCOUNTER — Other Ambulatory Visit: Payer: Self-pay | Admitting: Family

## 2015-09-12 NOTE — Telephone Encounter (Signed)
Pt called back and stated that she also need amLODipine (NORVASC) 5 MG tablet to be send into [facility] too.

## 2015-09-13 MED ORDER — AMLODIPINE BESYLATE 5 MG PO TABS: 5.0000 mg | ORAL_TABLET | Freq: Every day | ORAL | Status: DC

## 2015-09-13 NOTE — Addendum Note (Signed)
Addended by: Hillard DankerRAWFORD, ELIZABETH A on: 09/13/2015 08:25 AM   Modules accepted: Orders, Medications

## 2016-01-07 ENCOUNTER — Other Ambulatory Visit: Payer: Self-pay | Admitting: *Deleted

## 2016-01-07 MED ORDER — AMLODIPINE BESYLATE 5 MG PO TABS: 5.0000 mg | ORAL_TABLET | Freq: Every day | ORAL | Status: DC

## 2016-01-07 NOTE — Telephone Encounter (Signed)
Received call pt states she is needing refill on her amlodipine. Have appt for 6/9, but i out of med. Sent 30 day to [facility] until appt...Raechel Chute/lmb

## 2016-01-23 ENCOUNTER — Other Ambulatory Visit: Payer: Self-pay | Admitting: Obstetrics & Gynecology

## 2016-01-23 DIAGNOSIS — M8580 Other specified disorders of bone density and structure, unspecified site: Secondary | ICD-10-CM

## 2016-02-06 ENCOUNTER — Ambulatory Visit
Admission: RE | Admit: 2016-02-06 | Discharge: 2016-02-06 | Disposition: A | Discharge status: Home / Self Care | Payer: BLUE CROSS/BLUE SHIELD | Source: Ambulatory Visit | Attending: Obstetrics & Gynecology | Admitting: Obstetrics & Gynecology

## 2016-02-06 DIAGNOSIS — M8580 Other specified disorders of bone density and structure, unspecified site: Secondary | ICD-10-CM

## 2016-02-08 ENCOUNTER — Other Ambulatory Visit (INDEPENDENT_AMBULATORY_CARE_PROVIDER_SITE_OTHER): Payer: BLUE CROSS/BLUE SHIELD

## 2016-02-08 ENCOUNTER — Ambulatory Visit (INDEPENDENT_AMBULATORY_CARE_PROVIDER_SITE_OTHER): Payer: BLUE CROSS/BLUE SHIELD | Admitting: Internal Medicine

## 2016-02-08 ENCOUNTER — Encounter: Payer: Self-pay | Admitting: Internal Medicine

## 2016-02-08 VITALS — BP 128/68 | HR 58 | Temp 98.7°F | Resp 14 | Ht <= 58 in | Wt 90.8 lb

## 2016-02-08 DIAGNOSIS — I10 Essential (primary) hypertension: Secondary | ICD-10-CM

## 2016-02-08 DIAGNOSIS — K219 Gastro-esophageal reflux disease without esophagitis: Secondary | ICD-10-CM

## 2016-02-08 DIAGNOSIS — Z0000 Encounter for general adult medical examination without abnormal findings: Secondary | ICD-10-CM

## 2016-02-08 DIAGNOSIS — E785 Hyperlipidemia, unspecified: Secondary | ICD-10-CM | POA: Diagnosis not present

## 2016-02-08 LAB — COMPREHENSIVE METABOLIC PANEL
ALBUMIN: 4.3 g/dL (ref 3.5–5.2)
ALK PHOS: 94 U/L (ref 39–117)
ALT: 10 U/L (ref 0–35)
AST: 15 U/L (ref 0–37)
BUN: 12 mg/dL (ref 6–23)
CHLORIDE: 104 meq/L (ref 96–112)
CO2: 31 mEq/L (ref 19–32)
Calcium: 9.7 mg/dL (ref 8.4–10.5)
Creatinine, Ser: 0.57 mg/dL (ref 0.40–1.20)
GFR: 115.28 mL/min (ref >60.00)
Glucose, Bld: 94 mg/dL (ref 70–99)
POTASSIUM: 4.4 meq/L (ref 3.5–5.1)
SODIUM: 140 meq/L (ref 135–145)
TOTAL PROTEIN: 7.7 g/dL (ref 6.0–8.3)
Total Bilirubin: 0.7 mg/dL (ref 0.2–1.2)

## 2016-02-08 LAB — CBC
HEMATOCRIT: 43.2 % (ref 36.0–46.0)
Hemoglobin: 14.4 g/dL (ref 12.0–15.0)
MCHC: 33.4 g/dL (ref 30.0–36.0)
MCV: 92.7 fl (ref 78.0–100.0)
Platelets: 289 10*3/uL (ref 150.0–400.0)
RBC: 4.66 Mil/uL (ref 3.87–5.11)
RDW: 12.7 % (ref 11.5–15.5)
WBC: 7.6 10*3/uL (ref 4.0–10.5)

## 2016-02-08 MED ORDER — AMLODIPINE BESYLATE 5 MG PO TABS: 5.0000 mg | ORAL_TABLET | Freq: Every day | ORAL | Status: DC

## 2016-02-08 MED ORDER — OMEPRAZOLE 20 MG PO CPDR: 20.0000 mg | DELAYED_RELEASE_CAPSULE | Freq: Two times a day (BID) | ORAL | Status: DC

## 2016-02-08 NOTE — Patient Instructions (Signed)
We will check the blood work today and send the results on the mychart.   We can see you back in about 6 months for a check of the blood pressure. If you have any problems or questions please feel free to call us sooner.   Health Maintenance, Female Adopting a healthy lifestyle and getting preventive care can go a long way to promote health and wellness. Talk with your health care provider about what schedule of regular examinations is right for you. This is a good chance for you to check in with your provider about disease prevention and staying healthy. In between checkups, there are plenty of things you can do on your own. Experts have done a lot of research about which lifestyle changes and preventive measures are most likely to keep you healthy. Ask your health care provider for more information. WEIGHT AND DIET  Eat a healthy diet  Be sure to include plenty of vegetables, fruits, low-fat dairy products, and lean protein.  Do not eat a lot of foods high in solid fats, added sugars, or salt.  Get regular exercise. This is one of the most important things you can do for your health.  Most adults should exercise for at least 150 minutes each week. The exercise should increase your heart rate and make you sweat (moderate-intensity exercise).  Most adults should also do strengthening exercises at least twice a week. This is in addition to the moderate-intensity exercise.  Maintain a healthy weight  Body mass index (BMI) is a measurement that can be used to identify possible weight problems. It estimates body fat based on height and weight. Your health care provider can help determine your BMI and help you achieve or maintain a healthy weight.  For females 19 years of age and older:   A BMI below 18.5 is considered underweight.  A BMI of 18.5 to 24.9 is normal.  A BMI of 25 to 29.9 is considered overweight.  A BMI of 30 and above is considered obese.  Watch levels of cholesterol and  blood lipids  You should start having your blood tested for lipids and cholesterol at 59 years of age, then have this test every 5 years.  You may need to have your cholesterol levels checked more often if:  Your lipid or cholesterol levels are high.  You are older than 59 years of age.  You are at high risk for heart disease.  CANCER SCREENING   Lung Cancer  Lung cancer screening is recommended for adults 98-6 years old who are at high risk for lung cancer because of a history of smoking.  A yearly low-dose CT scan of the lungs is recommended for people who:  Currently smoke.  Have quit within the past 15 years.  Have at least a 30-pack-year history of smoking. A pack year is smoking an average of one pack of cigarettes a day for 1 year.  Yearly screening should continue until it has been 15 years since you quit.  Yearly screening should stop if you develop a health problem that would prevent you from having lung cancer treatment.  Breast Cancer  Practice breast self-awareness. This means understanding how your breasts normally appear and feel.  It also means doing regular breast self-exams. Let your health care provider know about any changes, no matter how small.  If you are in your 20s or 30s, you should have a clinical breast exam (CBE) by a health care provider every 1-3 years as part  of a regular health exam.  If you are 41 or older, have a CBE every year. Also consider having a breast X-ray (mammogram) every year.  If you have a family history of breast cancer, talk to your health care provider about genetic screening.  If you are at high risk for breast cancer, talk to your health care provider about having an MRI and a mammogram every year.  Breast cancer gene (BRCA) assessment is recommended for women who have family members with BRCA-related cancers. BRCA-related cancers include:  Breast.  Ovarian.  Tubal.  Peritoneal cancers.  Results of the  assessment will determine the need for genetic counseling and BRCA1 and BRCA2 testing. Cervical Cancer Your health care provider may recommend that you be screened regularly for cancer of the pelvic organs (ovaries, uterus, and vagina). This screening involves a pelvic examination, including checking for microscopic changes to the surface of your cervix (Pap test). You may be encouraged to have this screening done every 3 years, beginning at age 89.  For women ages 63-65, health care providers may recommend pelvic exams and Pap testing every 3 years, or they may recommend the Pap and pelvic exam, combined with testing for human papilloma virus (HPV), every 5 years. Some types of HPV increase your risk of cervical cancer. Testing for HPV may also be done on women of any age with unclear Pap test results.  Other health care providers may not recommend any screening for nonpregnant women who are considered low risk for pelvic cancer and who do not have symptoms. Ask your health care provider if a screening pelvic exam is right for you.  If you have had past treatment for cervical cancer or a condition that could lead to cancer, you need Pap tests and screening for cancer for at least 20 years after your treatment. If Pap tests have been discontinued, your risk factors (such as having a new sexual partner) need to be reassessed to determine if screening should resume. Some women have medical problems that increase the chance of getting cervical cancer. In these cases, your health care provider may recommend more frequent screening and Pap tests. Colorectal Cancer  This type of cancer can be detected and often prevented.  Routine colorectal cancer screening usually begins at 59 years of age and continues through 59 years of age.  Your health care provider may recommend screening at an earlier age if you have risk factors for colon cancer.  Your health care provider may also recommend using home test kits  to check for hidden blood in the stool.  A small camera at the end of a tube can be used to examine your colon directly (sigmoidoscopy or colonoscopy). This is done to check for the earliest forms of colorectal cancer.  Routine screening usually begins at age 24.  Direct examination of the colon should be repeated every 5-10 years through 59 years of age. However, you may need to be screened more often if early forms of precancerous polyps or small growths are found. Skin Cancer  Check your skin from head to toe regularly.  Tell your health care provider about any new moles or changes in moles, especially if there is a change in a mole's shape or color.  Also tell your health care provider if you have a mole that is larger than the size of a pencil eraser.  Always use sunscreen. Apply sunscreen liberally and repeatedly throughout the day.  Protect yourself by wearing long sleeves, pants, a  wide-brimmed hat, and sunglasses whenever you are outside. HEART DISEASE, DIABETES, AND HIGH BLOOD PRESSURE   High blood pressure causes heart disease and increases the risk of stroke. High blood pressure is more likely to develop in:  People who have blood pressure in the high end of the normal range (130-139/85-89 mm Hg).  People who are overweight or obese.  People who are African American.  If you are 88-76 years of age, have your blood pressure checked every 3-5 years. If you are 59 years of age or older, have your blood pressure checked every year. You should have your blood pressure measured twice--once when you are at a hospital or clinic, and once when you are not at a hospital or clinic. Record the average of the two measurements. To check your blood pressure when you are not at a hospital or clinic, you can use:  An automated blood pressure machine at a pharmacy.  A home blood pressure monitor.  If you are between 22 years and 56 years old, ask your health care provider if you should  take aspirin to prevent strokes.  Have regular diabetes screenings. This involves taking a blood sample to check your fasting blood sugar level.  If you are at a normal weight and have a low risk for diabetes, have this test once every three years after 59 years of age.  If you are overweight and have a high risk for diabetes, consider being tested at a younger age or more often. PREVENTING INFECTION  Hepatitis B  If you have a higher risk for hepatitis B, you should be screened for this virus. You are considered at high risk for hepatitis B if:  You were born in a country where hepatitis B is common. Ask your health care provider which countries are considered high risk.  Your parents were born in a high-risk country, and you have not been immunized against hepatitis B (hepatitis B vaccine).  You have HIV or AIDS.  You use needles to inject street drugs.  You live with someone who has hepatitis B.  You have had sex with someone who has hepatitis B.  You get hemodialysis treatment.  You take certain medicines for conditions, including cancer, organ transplantation, and autoimmune conditions. Hepatitis C  Blood testing is recommended for:  Everyone born from [birth year] through 1964.  Anyone with known risk factors for hepatitis C. Sexually transmitted infections (STIs)  You should be screened for sexually transmitted infections (STIs) including gonorrhea and chlamydia if:  You are sexually active and are younger than 59 years of age.  You are older than 59 years of age and your health care provider tells you that you are at risk for this type of infection.  Your sexual activity has changed since you were last screened and you are at an increased risk for chlamydia or gonorrhea. Ask your health care provider if you are at risk.  If you do not have HIV, but are at risk, it may be recommended that you take a prescription medicine daily to prevent HIV infection. This is called  pre-exposure prophylaxis (PrEP). You are considered at risk if:  You are sexually active and do not regularly use condoms or know the HIV status of your partner(s).  You take drugs by injection.  You are sexually active with a partner who has HIV. Talk with your health care provider about whether you are at high risk of being infected with HIV. If you choose to begin PrEP,  you should first be tested for HIV. You should then be tested every 3 months for as long as you are taking PrEP.  PREGNANCY   If you are premenopausal and you may become pregnant, ask your health care provider about preconception counseling.  If you may become pregnant, take 400 to 800 micrograms (mcg) of folic acid every day.  If you want to prevent pregnancy, talk to your health care provider about birth control (contraception). OSTEOPOROSIS AND MENOPAUSE   Osteoporosis is a disease in which the bones lose minerals and strength with aging. This can result in serious bone fractures. Your risk for osteoporosis can be identified using a bone density scan.  If you are 2 years of age or older, or if you are at risk for osteoporosis and fractures, ask your health care provider if you should be screened.  Ask your health care provider whether you should take a calcium or vitamin D supplement to lower your risk for osteoporosis.  Menopause may have certain physical symptoms and risks.  Hormone replacement therapy may reduce some of these symptoms and risks. Talk to your health care provider about whether hormone replacement therapy is right for you.  HOME CARE INSTRUCTIONS   Schedule regular health, dental, and eye exams.  Stay current with your immunizations.   Do not use any tobacco products including cigarettes, chewing tobacco, or electronic cigarettes.  If you are pregnant, do not drink alcohol.  If you are breastfeeding, limit how much and how often you drink alcohol.  Limit alcohol intake to no more than 1  drink per day for nonpregnant women. One drink equals 12 ounces of beer, 5 ounces of wine, or 1 ounces of hard liquor.  Do not use street drugs.  Do not share needles.  Ask your health care provider for help if you need support or information about quitting drugs.  Tell your health care provider if you often feel depressed.  Tell your health care provider if you have ever been abused or do not feel safe at home.   This information is not intended to replace advice given to you by your health care provider. Make sure you discuss any questions you have with your health care provider.   Document Released: 03/03/2011 Document Revised: 09/08/2014 Document Reviewed: 07/20/2013 Elsevier Interactive Patient Education [employer].

## 2016-02-08 NOTE — Assessment & Plan Note (Signed)
With exercise at goal off meds. Continue to monitor yearly.

## 2016-02-08 NOTE — Progress Notes (Signed)
   Subjective:    Patient ID: Danielle Terry, female    DOB: 09/08/1955, 60 y.o.   MRN: [id number]  HPI The patient is a 60 YO female coming in for wellness. No new concerns.   [facility] reviewed and updated.   Review of Systems  Constitutional: Negative for fever, activity change, appetite change, fatigue and unexpected weight change.  HENT: Negative.   Eyes: Negative.   Respiratory: Negative for cough, chest tightness, shortness of breath and wheezing.   Cardiovascular: Negative for chest pain, palpitations and leg swelling.  Gastrointestinal: Negative for nausea, abdominal pain, diarrhea, constipation and abdominal distention.  Musculoskeletal: Negative.   Skin: Negative.   Neurological: Negative.   Psychiatric/Behavioral: Negative.       Objective:   Physical Exam  Constitutional: She appears well-developed and well-nourished.  HENT:  Head: Normocephalic and atraumatic.  Eyes: EOM are normal.  Neck: Normal range of motion.  Cardiovascular: Normal rate and regular rhythm.   Pulmonary/Chest: Effort normal and breath sounds normal. No respiratory distress. She has no wheezes. She has no rales.  Abdominal: Soft. She exhibits no distension. There is no tenderness. There is no rebound.  Musculoskeletal: She exhibits no edema.  Neurological: Coordination normal.  Skin: Skin is warm and dry.  Psychiatric: She has a normal mood and affect.   Filed Vitals:   02/08/16 1039  BP: 128/68  Pulse: 58  Temp: 98.7 F (37.1 C)  TempSrc: Oral  Resp: 14  Height: 4\' 7"  (1.397 m)  Weight: 90 lb 12.8 oz (41.187 kg)  SpO2: 96%      Assessment & Plan:

## 2016-02-08 NOTE — Progress Notes (Signed)
Pre visit review using our clinic review tool, if applicable. No additional management support is needed unless otherwise documented below in the visit note. 

## 2016-02-08 NOTE — Assessment & Plan Note (Signed)
Colonoscopy up to date. Declines tetanus and flu shot. Checking labs today. Last LDL at goal. Exercising weekly and non-smoker. Given screening recommendations.

## 2016-02-08 NOTE — Assessment & Plan Note (Signed)
BP at goal on amlodipine 5mg daily.  

## 2016-02-08 NOTE — Assessment & Plan Note (Signed)
Symptoms controlled on prilosec, she can notice if she misses a dose and wants to continue.

## 2016-02-12 ENCOUNTER — Telehealth: Payer: Self-pay | Admitting: Internal Medicine

## 2016-02-12 NOTE — Telephone Encounter (Signed)
Left message for patient to call back to discuss which medications did not get sent in.

## 2016-02-12 NOTE — Telephone Encounter (Signed)
Please follow up with patient.  States Dr. Okey Duprerawford did not send her medication to the pharmacy.

## 2016-02-13 NOTE — Telephone Encounter (Signed)
Patient called again about the medication. States the medication is not the right medicatons she has been taking for BP. pLease follow up. THank you

## 2016-02-14 ENCOUNTER — Encounter: Payer: Self-pay | Admitting: Internal Medicine

## 2016-02-14 ENCOUNTER — Other Ambulatory Visit: Payer: Self-pay | Admitting: Geriatric Medicine

## 2016-02-14 MED ORDER — AMLODIPINE BESYLATE 2.5 MG PO TABS: 2.5000 mg | ORAL_TABLET | Freq: Every day | ORAL | Status: DC

## 2016-02-14 NOTE — Telephone Encounter (Signed)
Amlodipine 5 mg was d/c'd at the last office visit. It was decreased to 2.5 mg daily. New Rx was sent in today for amlodipine 2.5 mg once daily. Patient aware.

## 2016-06-04 ENCOUNTER — Other Ambulatory Visit: Payer: Self-pay | Admitting: Orthopaedic Surgery

## 2016-06-04 DIAGNOSIS — M4316 Spondylolisthesis, lumbar region: Secondary | ICD-10-CM

## 2016-06-15 ENCOUNTER — Ambulatory Visit
Admission: RE | Admit: 2016-06-15 | Discharge: 2016-06-15 | Disposition: A | Discharge status: Home / Self Care | Payer: BLUE CROSS/BLUE SHIELD | Source: Ambulatory Visit | Attending: Orthopaedic Surgery | Admitting: Orthopaedic Surgery

## 2016-06-15 DIAGNOSIS — M4316 Spondylolisthesis, lumbar region: Secondary | ICD-10-CM

## 2016-08-11 ENCOUNTER — Encounter: Payer: Self-pay | Admitting: Internal Medicine

## 2016-08-11 ENCOUNTER — Other Ambulatory Visit (INDEPENDENT_AMBULATORY_CARE_PROVIDER_SITE_OTHER): Payer: BLUE CROSS/BLUE SHIELD

## 2016-08-11 ENCOUNTER — Ambulatory Visit (INDEPENDENT_AMBULATORY_CARE_PROVIDER_SITE_OTHER): Payer: BLUE CROSS/BLUE SHIELD | Admitting: Internal Medicine

## 2016-08-11 VITALS — BP 130/80 | HR 60 | Temp 97.9°F | Resp 16 | Ht <= 58 in | Wt 89.1 lb

## 2016-08-11 DIAGNOSIS — Z1159 Encounter for screening for other viral diseases: Secondary | ICD-10-CM

## 2016-08-11 DIAGNOSIS — E785 Hyperlipidemia, unspecified: Secondary | ICD-10-CM | POA: Diagnosis not present

## 2016-08-11 DIAGNOSIS — I10 Essential (primary) hypertension: Secondary | ICD-10-CM | POA: Diagnosis not present

## 2016-08-11 DIAGNOSIS — Z0000 Encounter for general adult medical examination without abnormal findings: Secondary | ICD-10-CM

## 2016-08-11 LAB — LIPID PANEL
CHOL/HDL RATIO: 3
Cholesterol: 188 mg/dL (ref 0–200)
HDL: 65.6 mg/dL (ref >39.00)
LDL Cholesterol: 105 mg/dL — ABNORMAL HIGH (ref 0–99)
NONHDL: 121.98
Triglycerides: 84 mg/dL (ref 0.0–149.0)
VLDL: 16.8 mg/dL (ref 0.0–40.0)

## 2016-08-11 LAB — COMPREHENSIVE METABOLIC PANEL
ALT: 9 U/L (ref 0–35)
AST: 15 U/L (ref 0–37)
Albumin: 4.2 g/dL (ref 3.5–5.2)
Alkaline Phosphatase: 81 U/L (ref 39–117)
BILIRUBIN TOTAL: 0.8 mg/dL (ref 0.2–1.2)
BUN: 13 mg/dL (ref 6–23)
CALCIUM: 9.6 mg/dL (ref 8.4–10.5)
CHLORIDE: 105 meq/L (ref 96–112)
CO2: 29 meq/L (ref 19–32)
Creatinine, Ser: 0.61 mg/dL (ref 0.40–1.20)
GFR: 106.42 mL/min (ref >60.00)
GLUCOSE: 102 mg/dL — AB (ref 70–99)
POTASSIUM: 4.1 meq/L (ref 3.5–5.1)
Sodium: 141 mEq/L (ref 135–145)
Total Protein: 7.5 g/dL (ref 6.0–8.3)

## 2016-08-11 LAB — HEPATITIS C ANTIBODY: HCV AB: NEGATIVE

## 2016-08-11 LAB — LIPASE: Lipase: 34 U/L (ref 11.0–59.0)

## 2016-08-11 MED ORDER — OMEPRAZOLE 20 MG PO CPDR: 20.0000 mg | DELAYED_RELEASE_CAPSULE | Freq: Two times a day (BID) | ORAL | 3 refills | Status: DC

## 2016-08-11 MED ORDER — AMLODIPINE BESYLATE 2.5 MG PO TABS: 2.5000 mg | ORAL_TABLET | Freq: Every day | ORAL | 3 refills | Status: DC

## 2016-08-11 NOTE — Progress Notes (Signed)
   Subjective:    Patient ID: Danielle Terry, female    DOB: 01/14/1956, 60 y.o.   MRN: [id number]  HPI The patient is a 60 YO female coming in for follow up of her blood pressure. She changed to lower dose of amlodipine at last visit. She is doing well at home. No more symptoms of low blood pressure. No headaches or chest pains or SOB.   Review of Systems  Constitutional: Negative.   Respiratory: Negative.   Cardiovascular: Negative.   Gastrointestinal: Negative.   Musculoskeletal: Negative.   Neurological: Negative.       Objective:   Physical Exam  Constitutional: She is oriented to person, place, and time. She appears well-developed and well-nourished.  HENT:  Head: Normocephalic and atraumatic.  Eyes: EOM are normal.  Cardiovascular: Normal rate and regular rhythm.   Pulmonary/Chest: Effort normal and breath sounds normal. No respiratory distress. She has no wheezes. She has no rales.  Abdominal: Soft. She exhibits no distension. There is no tenderness. There is no rebound.  Neurological: She is alert and oriented to person, place, and time.  Skin: Skin is warm and dry.   Vitals:   08/11/16 1032  BP: 130/80  Pulse: 60  Resp: 16  Temp: 97.9 F (36.6 C)  TempSrc: Oral  SpO2: 97%  Weight: 89 lb 1.9 oz (40.4 kg)  Height: 4\' 8"  (1.422 m)      Assessment & Plan:

## 2016-08-11 NOTE — Assessment & Plan Note (Signed)
BP at goal on her amlodipine 2.5 mg daily and will continue.

## 2016-08-11 NOTE — Assessment & Plan Note (Signed)
Currently controlled on diet and checking lipid panel today. LDL goal <130.

## 2016-08-11 NOTE — Patient Instructions (Signed)
We will check the blood work today including the hepatitis test and the pancreas and cholesterol and kidneys and liver.   We have sent in the refills for you for a whole year.

## 2016-08-11 NOTE — Progress Notes (Signed)
Pre visit review using our clinic review tool, if applicable. No additional management support is needed unless otherwise documented below in the visit note. 

## 2017-07-21 ENCOUNTER — Other Ambulatory Visit: Payer: Self-pay | Admitting: Obstetrics & Gynecology

## 2017-07-21 DIAGNOSIS — M810 Age-related osteoporosis without current pathological fracture: Secondary | ICD-10-CM

## 2017-08-11 ENCOUNTER — Encounter: Payer: Self-pay | Admitting: Internal Medicine

## 2017-08-14 ENCOUNTER — Other Ambulatory Visit (INDEPENDENT_AMBULATORY_CARE_PROVIDER_SITE_OTHER): Payer: BLUE CROSS/BLUE SHIELD

## 2017-08-14 ENCOUNTER — Ambulatory Visit (INDEPENDENT_AMBULATORY_CARE_PROVIDER_SITE_OTHER): Payer: BLUE CROSS/BLUE SHIELD | Admitting: Internal Medicine

## 2017-08-14 ENCOUNTER — Encounter: Payer: Self-pay | Admitting: Internal Medicine

## 2017-08-14 VITALS — BP 122/86 | HR 69 | Temp 98.7°F | Ht <= 58 in | Wt 87.0 lb

## 2017-08-14 DIAGNOSIS — Z0000 Encounter for general adult medical examination without abnormal findings: Secondary | ICD-10-CM

## 2017-08-14 DIAGNOSIS — E785 Hyperlipidemia, unspecified: Secondary | ICD-10-CM

## 2017-08-14 DIAGNOSIS — K219 Gastro-esophageal reflux disease without esophagitis: Secondary | ICD-10-CM | POA: Diagnosis not present

## 2017-08-14 DIAGNOSIS — I10 Essential (primary) hypertension: Secondary | ICD-10-CM

## 2017-08-14 LAB — CBC
HEMATOCRIT: 42.9 % (ref 36.0–46.0)
Hemoglobin: 14.5 g/dL (ref 12.0–15.0)
MCHC: 33.9 g/dL (ref 30.0–36.0)
MCV: 94.5 fl (ref 78.0–100.0)
Platelets: 261 10*3/uL (ref 150.0–400.0)
RBC: 4.54 Mil/uL (ref 3.87–5.11)
RDW: 12.5 % (ref 11.5–15.5)
WBC: 6.1 10*3/uL (ref 4.0–10.5)

## 2017-08-14 LAB — COMPREHENSIVE METABOLIC PANEL
ALBUMIN: 4 g/dL (ref 3.5–5.2)
ALT: 8 U/L (ref 0–35)
AST: 15 U/L (ref 0–37)
Alkaline Phosphatase: 89 U/L (ref 39–117)
BUN: 14 mg/dL (ref 6–23)
CHLORIDE: 104 meq/L (ref 96–112)
CO2: 30 mEq/L (ref 19–32)
Calcium: 9.3 mg/dL (ref 8.4–10.5)
Creatinine, Ser: 0.61 mg/dL (ref 0.40–1.20)
GFR: 106.06 mL/min (ref >60.00)
GLUCOSE: 82 mg/dL (ref 70–99)
POTASSIUM: 4.1 meq/L (ref 3.5–5.1)
SODIUM: 140 meq/L (ref 135–145)
Total Bilirubin: 1 mg/dL (ref 0.2–1.2)
Total Protein: 7.2 g/dL (ref 6.0–8.3)

## 2017-08-14 LAB — LIPID PANEL
CHOLESTEROL: 182 mg/dL (ref 0–200)
HDL: 68.8 mg/dL (ref >39.00)
LDL CALC: 95 mg/dL (ref 0–99)
NonHDL: 113
Total CHOL/HDL Ratio: 3
Triglycerides: 88 mg/dL (ref 0.0–149.0)
VLDL: 17.6 mg/dL (ref 0.0–40.0)

## 2017-08-14 LAB — URINALYSIS, ROUTINE W REFLEX MICROSCOPIC
Bilirubin Urine: NEGATIVE
Hgb urine dipstick: NEGATIVE
KETONES UR: NEGATIVE
Leukocytes, UA: NEGATIVE
Nitrite: NEGATIVE
PH: 5.5 (ref 5.0–8.0)
RBC / HPF: NONE SEEN (ref 0–?)
TOTAL PROTEIN, URINE-UPE24: NEGATIVE
URINE GLUCOSE: NEGATIVE
UROBILINOGEN UA: 0.2 (ref 0.0–1.0)

## 2017-08-14 LAB — VITAMIN D 25 HYDROXY (VIT D DEFICIENCY, FRACTURES): VITD: 56.8 ng/mL (ref 30.00–100.00)

## 2017-08-14 LAB — HEMOGLOBIN A1C: HEMOGLOBIN A1C: 5.5 % (ref 4.6–6.5)

## 2017-08-14 MED ORDER — AMLODIPINE BESYLATE 2.5 MG PO TABS: 2.5000 mg | ORAL_TABLET | Freq: Every day | ORAL | 3 refills | Status: AC

## 2017-08-14 MED ORDER — OMEPRAZOLE 20 MG PO CPDR: 20.0000 mg | DELAYED_RELEASE_CAPSULE | Freq: Two times a day (BID) | ORAL | 3 refills | Status: AC

## 2017-08-14 NOTE — Assessment & Plan Note (Signed)
Taking amlodipine 2.5 mg daily and BP at goal, checking CMP and adjust as needed.  

## 2017-08-14 NOTE — Patient Instructions (Signed)

## 2017-08-14 NOTE — Assessment & Plan Note (Signed)
Refill omeprazole BID as needed. She is having good control with this.

## 2017-08-14 NOTE — Progress Notes (Signed)
   Subjective:    Patient ID: Danielle Terry, female    DOB: 08/08/1956, 61 y.o.   MRN: [id number]  HPI The patient is a 61 YO female coming in for physical.   [facility], social history reviewed and updated.   Review of Systems  Constitutional: Negative.   HENT: Negative.   Eyes: Negative.   Respiratory: Negative for cough, chest tightness and shortness of breath.   Cardiovascular: Negative for chest pain, palpitations and leg swelling.  Gastrointestinal: Negative for abdominal distention, abdominal pain, constipation, diarrhea, nausea and vomiting.  Musculoskeletal: Positive for back pain.  Skin: Negative.   Neurological: Negative.   Psychiatric/Behavioral: Negative.       Objective:   Physical Exam  Constitutional: She is oriented to person, place, and time. She appears well-developed and well-nourished.  HENT:  Head: Normocephalic and atraumatic.  Eyes: EOM are normal.  Neck: Normal range of motion.  Cardiovascular: Normal rate and regular rhythm.  Pulmonary/Chest: Effort normal and breath sounds normal. No respiratory distress. She has no wheezes. She has no rales.  Abdominal: Soft. Bowel sounds are normal. She exhibits no distension. There is no tenderness. There is no rebound.  Musculoskeletal: She exhibits no edema.  Neurological: She is alert and oriented to person, place, and time. Coordination normal.  Skin: Skin is warm and dry.  Psychiatric: She has a normal mood and affect.   Vitals:   08/14/17 0955  BP: 122/86  Pulse: 69  Temp: 98.7 F (37.1 C)  TempSrc: Oral  SpO2: 98%  Weight: 87 lb (39.5 kg)  Height: 4\' 8"  (1.422 m)      Assessment & Plan:

## 2017-08-14 NOTE — Assessment & Plan Note (Signed)
Checking labs, colonoscopy and mammogram and pap smear up to date. Counseled about shingrix and she will think about it. Declines flu shot and tetanus shot. Given screening recommendations. Counseled about sun safety and mole surveillance. Counseled about dangers of distracted driving. Given 10 year screening recommendations.

## 2017-08-14 NOTE — Assessment & Plan Note (Signed)
Diet controlled, checking lipid panel and adjust as needed.

## 2017-12-30 ENCOUNTER — Other Ambulatory Visit: Payer: Self-pay | Admitting: Obstetrics & Gynecology

## 2017-12-30 DIAGNOSIS — Z139 Encounter for screening, unspecified: Secondary | ICD-10-CM

## 2018-04-09 ENCOUNTER — Other Ambulatory Visit: Payer: BLUE CROSS/BLUE SHIELD

## 2018-04-09 ENCOUNTER — Ambulatory Visit: Payer: BLUE CROSS/BLUE SHIELD

## 2018-08-17 ENCOUNTER — Encounter: Payer: BLUE CROSS/BLUE SHIELD | Admitting: Internal Medicine

## 2018-10-19 ENCOUNTER — Emergency Department (HOSPITAL_COMMUNITY): Payer: BLUE CROSS/BLUE SHIELD

## 2018-10-19 ENCOUNTER — Encounter (HOSPITAL_COMMUNITY): Payer: Self-pay

## 2018-10-19 ENCOUNTER — Other Ambulatory Visit: Payer: Self-pay

## 2018-10-19 ENCOUNTER — Emergency Department (HOSPITAL_COMMUNITY)
Admission: EM | Admit: 2018-10-19 | Discharge: 2018-10-19 | Disposition: A | Discharge status: Home / Self Care | Payer: BLUE CROSS/BLUE SHIELD | Attending: Emergency Medicine | Admitting: Emergency Medicine

## 2018-10-19 DIAGNOSIS — I10 Essential (primary) hypertension: Secondary | ICD-10-CM | POA: Diagnosis not present

## 2018-10-19 DIAGNOSIS — Z79899 Other long term (current) drug therapy: Secondary | ICD-10-CM | POA: Diagnosis not present

## 2018-10-19 DIAGNOSIS — M5416 Radiculopathy, lumbar region: Secondary | ICD-10-CM | POA: Insufficient documentation

## 2018-10-19 DIAGNOSIS — M545 Low back pain: Secondary | ICD-10-CM | POA: Diagnosis present

## 2018-10-19 MED ORDER — IBUPROFEN 400 MG PO TABS
400.0000 mg | ORAL_TABLET | Freq: Once | ORAL | Status: AC
  Administered 2018-10-19: 400 mg via ORAL
  Filled 2018-10-19: qty 1

## 2018-10-19 MED ORDER — METHOCARBAMOL 500 MG PO TABS
500.0000 mg | ORAL_TABLET | Freq: Once | ORAL | Status: AC
  Administered 2018-10-19: 500 mg via ORAL
  Filled 2018-10-19: qty 1

## 2018-10-19 MED ORDER — ACETAMINOPHEN 325 MG PO TABS
650.0000 mg | ORAL_TABLET | ORAL | Status: DC | PRN
  Administered 2018-10-19: 650 mg via ORAL
  Filled 2018-10-19: qty 2

## 2018-10-19 MED ORDER — METHOCARBAMOL 500 MG PO TABS: 500.0000 mg | ORAL_TABLET | Freq: Two times a day (BID) | ORAL | 0 refills | Status: AC | PRN

## 2018-10-19 NOTE — ED Notes (Signed)
Declined W/C at D/C and was escorted to lobby by RN. 
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
## 2018-10-21 ENCOUNTER — Emergency Department (HOSPITAL_COMMUNITY)
Admission: EM | Admit: 2018-10-21 | Discharge: 2018-10-21 | Disposition: A | Discharge status: Home / Self Care | Payer: BLUE CROSS/BLUE SHIELD | Attending: Emergency Medicine | Admitting: Emergency Medicine

## 2018-10-21 ENCOUNTER — Other Ambulatory Visit: Payer: Self-pay

## 2018-10-21 ENCOUNTER — Encounter (HOSPITAL_COMMUNITY): Payer: Self-pay | Admitting: Emergency Medicine

## 2018-10-21 DIAGNOSIS — Y92410 Unspecified street and highway as the place of occurrence of the external cause: Secondary | ICD-10-CM | POA: Insufficient documentation

## 2018-10-21 DIAGNOSIS — M545 Low back pain, unspecified: Secondary | ICD-10-CM

## 2018-10-21 DIAGNOSIS — Z79899 Other long term (current) drug therapy: Secondary | ICD-10-CM | POA: Insufficient documentation

## 2018-10-21 DIAGNOSIS — I10 Essential (primary) hypertension: Secondary | ICD-10-CM | POA: Diagnosis not present

## 2018-10-21 DIAGNOSIS — Y999 Unspecified external cause status: Secondary | ICD-10-CM | POA: Insufficient documentation

## 2018-10-21 DIAGNOSIS — Y9389 Activity, other specified: Secondary | ICD-10-CM | POA: Insufficient documentation

## 2018-10-21 MED ORDER — METHOCARBAMOL 500 MG PO TABS: 500.0000 mg | ORAL_TABLET | Freq: Once | ORAL | Status: DC

## 2018-10-21 MED ORDER — IBUPROFEN 400 MG PO TABS: 600.0000 mg | ORAL_TABLET | Freq: Once | ORAL | Status: DC
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
## 2018-10-21 NOTE — ED Notes (Signed)
Patient verbalizes understanding of discharge instructions. Opportunity for questioning and answers were provided. Armband removed by staff, pt discharged from ED.  
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
